# Patient Record
Sex: Female | Born: 1980 | Race: White | Hispanic: No | Marital: Married | Smoking: Never smoker
Health system: Southern US, Community
[De-identification: ages and names within clinical notes are randomized; demographics above are authoritative.]

## PROBLEM LIST (undated history)

## (undated) DIAGNOSIS — R51 Headache: Secondary | ICD-10-CM

## (undated) DIAGNOSIS — I471 Supraventricular tachycardia: Secondary | ICD-10-CM

## (undated) DIAGNOSIS — G8929 Other chronic pain: Secondary | ICD-10-CM

## (undated) DIAGNOSIS — I4719 Other supraventricular tachycardia: Secondary | ICD-10-CM

## (undated) HISTORY — DX: Other supraventricular tachycardia: I47.19

## (undated) HISTORY — DX: Supraventricular tachycardia: I47.1

---

## 2000-09-03 ENCOUNTER — Encounter: Payer: Self-pay | Admitting: Emergency Medicine

## 2000-09-03 ENCOUNTER — Emergency Department (HOSPITAL_COMMUNITY): Admission: EM | Admit: 2000-09-03 | Discharge: 2000-09-03 | Payer: Self-pay | Admitting: Emergency Medicine

## 2012-02-29 ENCOUNTER — Emergency Department (INDEPENDENT_AMBULATORY_CARE_PROVIDER_SITE_OTHER)
Admission: EM | Admit: 2012-02-29 | Discharge: 2012-02-29 | Disposition: A | Discharge status: Home / Self Care | Payer: Federal, State, Local not specified - PPO | Source: Home / Self Care | Attending: Family Medicine | Admitting: Family Medicine

## 2012-02-29 DIAGNOSIS — H9202 Otalgia, left ear: Secondary | ICD-10-CM

## 2012-02-29 DIAGNOSIS — J069 Acute upper respiratory infection, unspecified: Secondary | ICD-10-CM

## 2012-02-29 DIAGNOSIS — H9209 Otalgia, unspecified ear: Secondary | ICD-10-CM

## 2012-02-29 DIAGNOSIS — M940 Chondrocostal junction syndrome [Tietze]: Secondary | ICD-10-CM

## 2012-02-29 HISTORY — DX: Other chronic pain: G89.29

## 2012-02-29 HISTORY — DX: Headache: R51

## 2012-02-29 MED ORDER — AZITHROMYCIN 250 MG PO TABS: ORAL_TABLET | ORAL | Status: AC

## 2012-02-29 MED ORDER — BENZONATATE 200 MG PO CAPS: 200.0000 mg | ORAL_CAPSULE | Freq: Every day | ORAL | Status: AC

## 2012-02-29 NOTE — ED Notes (Signed)
Patient presents with a productive cough and nasal congestion x couple days. She states her sputum is thick and white. She tried OTC cold and congestion without relief. She also complains of left ear pain.

## 2012-02-29 NOTE — Discharge Instructions (Signed)
Take Mucinex D (guaifenesin with decongestant) twice daily for congestion.  Increase fluid intake, rest. May use Afrin nasal spray (or generic oxymetazoline) twice daily for about 5 days.  Also recommend using saline nasal spray several times daily and saline nasal irrigation (AYR is a common brand) Stop all antihistamines for now, and other non-prescription cough/cold preparations. May take Ibuprofen 200mg , 4 tabs every 8 hours with food for chest/sternum discomfort. Begin Azithromycin if not improving about 5 days or if persistent fever develops. Follow-up with family doctor if not improving 7 to 10 days.

## 2012-02-29 NOTE — ED Provider Notes (Addendum)
History     CSN: [id number]  Arrival date & time 02/29/12  1635   First MD Initiated Contact with Patient 02/29/12 1739      Chief Complaint  Patient presents with  . Cough    couple days  . Nasal Congestion    couple  days      HPI Comments: Patient complains of approximately 3 day history of gradually progressive URI symptoms beginning with a non-productive cough and a mild sore throat (now improved), followed by nasal congestion.  Complains of fatigue and initial myalgias.  Cough is now worse at night and generally non-productive during the day.  There has been no pleuritic pain, but she does complain of anterior chest discomfort with cough.  No wheezes or shortness of breath.  She has now developed a mild left earache, and left hearing is muffled.  She states that she often coughs until she gags.  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  The history is provided by the patient.    Past Medical History  Diagnosis Date  . Chronic headaches     Past Surgical History  Procedure Date  . Cesarean section     Family History  Problem Relation Age of Onset  . Heart attack Father     History  Substance Use Topics  . Smoking status: Never Smoker   . Smokeless tobacco: Never Used  . Alcohol Use: No    OB History    Grav Para Term Preterm Abortions TAB SAB Ect Mult Living                  Review of Systems + sore throat + cough No pleuritic pain, but complains of anterior chest burning sensation with cough and inspiration No wheezing + nasal congestion + post-nasal drainage No sinus pain/pressure No itchy/red eyes + left earache No hemoptysis No SOB ? fever, + chills/feels hot No nausea No vomiting No abdominal pain No diarrhea No urinary symptoms No skin rashes + fatigue + myalgias + headache Used OTC meds without relief (Nyquil) Allergies  Review of patient's allergies indicates no known allergies.  Home Medications   Current  Outpatient Rx  Name Route Sig Dispense Refill  . NORETHINDRONE ACET-ETHINYL EST 1-20 MG-MCG PO TABS Oral Take 1 tablet by mouth daily.    . AZITHROMYCIN 250 MG PO TABS  Take 2 tabs today; then begin one tab once daily for 4 more days (Rx void after 03/07/12) 6 each 0  . BENZONATATE 200 MG PO CAPS Oral Take 1 capsule (200 mg total) by mouth at bedtime. Take as needed for cough 12 capsule 0    BP 122/87  Pulse 96  Temp(Src) 99.1 F (37.3 C) (Oral)  Resp 18  Ht 5\' 9"  (1.753 m)  Wt 233 lb (105.688 kg)  BMI 34.41 kg/m2  SpO2 99%  Physical Exam Nursing notes and Vital Signs reviewed. She is Alert and oriented, euthymic mood and affect. Appearance:  Patient appears healthy, stated age, and in no acute distress.  Patient is obese (BMI 34.5) Eyes:  Pupils are equal, round, and reactive to light and accomodation.  Extraocular movement is intact.  Conjunctivae are not inflamed  Ears:  Canals normal.  Tympanic membranes normal.  Nose:  Mildly congested turbinates.  No sinus tenderness.   Pharynx:  Normal Neck:  Supple.  Slightly tender shotty posterior nodes are palpated bilaterally  Lungs:  Clear to auscultation.  Breath sounds are equal.  Chest:  Distinct tenderness to palpation over the mid-sternum. - Heart:  Regular rate and rhythm without murmurs, rubs, or gallops.  Abdomen:  Nontender without masses or hepatosplenomegaly.  Bowel sounds are present.  No CVA or flank tenderness.  Extremities:  No edema.  No calf tenderness Skin:  No rash present.   ED Course  Procedures  none   Tympanogram:  Positive peak pressure both ears.   1. Acute upper respiratory infections of unspecified site   2. Otalgia of left ear   3. Costochondritis, acute       MDM  There is no evidence of bacterial infection today.   Treat symptomatically for now.  Prescription written for Benzonatate [facility]) to take at bedtime for night-time cough.  Take Mucinex D (guaifenesin with decongestant) twice daily  for congestion.  Increase fluid intake, rest. May use Afrin nasal spray (or generic oxymetazoline) twice daily for about 5 days.  Also recommend using saline nasal spray several times daily and saline nasal irrigation (AYR is a common brand) Stop all antihistamines for now, and other non-prescription cough/cold preparations. May take Ibuprofen 200mg , 4 tabs every 8 hours with food for chest/sternum discomfort. Begin Azithromycin if not improving about 5 days or if persistent fever develops. Follow-up with family doctor if not improving 7 to 10 days.        Lattie Haw, MD 02/29/12 1815  Lattie Haw, MD 02/29/12 1816

## 2012-04-06 ENCOUNTER — Other Ambulatory Visit (HOSPITAL_COMMUNITY): Payer: Self-pay | Admitting: Chiropractic Medicine

## 2012-04-06 ENCOUNTER — Ambulatory Visit (HOSPITAL_COMMUNITY)
Admission: RE | Admit: 2012-04-06 | Discharge: 2012-04-06 | Disposition: A | Discharge status: Home / Self Care | Payer: Federal, State, Local not specified - PPO | Source: Ambulatory Visit | Attending: Chiropractic Medicine | Admitting: Chiropractic Medicine

## 2012-04-06 DIAGNOSIS — M542 Cervicalgia: Secondary | ICD-10-CM

## 2012-04-06 DIAGNOSIS — T1490XA Injury, unspecified, initial encounter: Secondary | ICD-10-CM | POA: Insufficient documentation

## 2012-04-06 DIAGNOSIS — M545 Low back pain, unspecified: Secondary | ICD-10-CM | POA: Insufficient documentation

## 2012-04-06 DIAGNOSIS — Y9366 Activity, soccer: Secondary | ICD-10-CM | POA: Insufficient documentation

## 2012-04-06 DIAGNOSIS — X58XXXA Exposure to other specified factors, initial encounter: Secondary | ICD-10-CM | POA: Insufficient documentation

## 2012-04-06 DIAGNOSIS — M549 Dorsalgia, unspecified: Secondary | ICD-10-CM

## 2012-04-06 DIAGNOSIS — R209 Unspecified disturbances of skin sensation: Secondary | ICD-10-CM | POA: Insufficient documentation

## 2012-09-06 ENCOUNTER — Emergency Department (INDEPENDENT_AMBULATORY_CARE_PROVIDER_SITE_OTHER): Payer: Federal, State, Local not specified - PPO

## 2012-09-06 ENCOUNTER — Emergency Department (INDEPENDENT_AMBULATORY_CARE_PROVIDER_SITE_OTHER)
Admission: EM | Admit: 2012-09-06 | Discharge: 2012-09-06 | Disposition: A | Discharge status: Home / Self Care | Payer: Federal, State, Local not specified - PPO | Source: Home / Self Care

## 2012-09-06 ENCOUNTER — Encounter: Payer: Self-pay | Admitting: *Deleted

## 2012-09-06 DIAGNOSIS — X58XXXA Exposure to other specified factors, initial encounter: Secondary | ICD-10-CM

## 2012-09-06 DIAGNOSIS — M25539 Pain in unspecified wrist: Secondary | ICD-10-CM

## 2012-09-06 DIAGNOSIS — S59909A Unspecified injury of unspecified elbow, initial encounter: Secondary | ICD-10-CM

## 2012-09-06 DIAGNOSIS — S63509A Unspecified sprain of unspecified wrist, initial encounter: Secondary | ICD-10-CM

## 2012-09-06 DIAGNOSIS — M79609 Pain in unspecified limb: Secondary | ICD-10-CM

## 2012-09-06 DIAGNOSIS — S59919A Unspecified injury of unspecified forearm, initial encounter: Secondary | ICD-10-CM

## 2012-09-06 NOTE — ED Notes (Signed)
Pt c/o RT wrist injury x last night while playing with her son. No OTC meds.

## 2012-09-06 NOTE — ED Provider Notes (Signed)
History     CSN: [id number]  Arrival date & time 09/06/12  1817   None     Chief Complaint  Patient presents with  . Wrist Pain   HPI Comments: Pt was playing with son yesterday, felt pop in R wrist.  No direct trauma per pt.  Has had persistent R wrist and forearm pain since this point.    Patient is a 31 y.o. female presenting with wrist pain.  Wrist Pain This is a new problem. The current episode started yesterday. The problem occurs constantly.    Past Medical History  Diagnosis Date  . Chronic headaches     Past Surgical History  Procedure Date  . Cesarean section     Family History  Problem Relation Age of Onset  . Heart attack Father     History  Substance Use Topics  . Smoking status: Never Smoker   . Smokeless tobacco: Never Used  . Alcohol Use: No    OB History    Grav Para Term Preterm Abortions TAB SAB Ect Mult Living                  Review of Systems  All other systems reviewed and are negative.    Allergies  Review of patient's allergies indicates no known allergies.  Home Medications   Current Outpatient Rx  Name Route Sig Dispense Refill  . NORETHINDRONE ACET-ETHINYL EST 1-20 MG-MCG PO TABS Oral Take 1 tablet by mouth daily.      BP 115/83  Pulse 87  Temp 97.8 F (36.6 C) (Oral)  Resp 18  Ht 5\' 9"  (1.753 m)  Wt 232 lb (105.235 kg)  BMI 34.26 kg/m2  SpO2 100%  LMP 08/13/2012  Physical Exam  Constitutional: She appears well-developed and well-nourished.  HENT:  Head: Normocephalic and atraumatic.  Eyes: Conjunctivae normal are normal. Pupils are equal, round, and reactive to light.  Neck: Normal range of motion. Neck supple.  Cardiovascular: Normal rate and regular rhythm.   Pulmonary/Chest: Effort normal and breath sounds normal.  Abdominal: Soft. Bowel sounds are normal.  Musculoskeletal:       + TTP along R wrist, ulnar sided with some radiation along R lateral forearm.  Full ROM.  + wrist pain flexion and  extension.   Neurological: She is alert.  Skin: Skin is warm.    ED Course  Procedures (including critical care time)  Labs Reviewed - No data to display Dg Forearm Right  09/06/2012  *RADIOLOGY REPORT*  Clinical Data: Injury with pain  RIGHT FOREARM - 2 VIEW  Comparison: None.  Findings: No evidence of fracture, dislocation or other focal finding.  IMPRESSION: Normal   Original Report Authenticated By: Thomasenia Sales, M.D.    Dg Wrist Complete Right  09/06/2012  *RADIOLOGY REPORT*  Clinical Data: Injury with pain  RIGHT WRIST - COMPLETE 3+ VIEW  Comparison: None.  Findings: No evidence of fracture, dislocation, degenerative change or other focal finding.  IMPRESSION: Normal radiographs   Original Report Authenticated By: Thomasenia Sales, M.D.      1. Wrist sprain       MDM  Xrays negative for fracture  RICE and NSAIDs.  Will place in wrist brace.  Discussed general red flags for reevaluation. Follow up as needed.      The patient and/or caregiver has been counseled thoroughly with regard to treatment plan and/or medications prescribed including dosage, schedule, interactions, rationale for use, and possible side effects and they verbalize  understanding. Diagnoses and expected course of recovery discussed and will return if not improved as expected or if the condition worsens. Patient and/or caregiver verbalized understanding.             Doree Albee, MD 09/16/12 [phone]

## 2014-01-24 IMAGING — CR DG FOREARM 2V*R*
2 series · 2 of 2 positions shown · non-contrast
Comparison: None.

CLINICAL DATA: Injury with pain

RIGHT FOREARM - 2 VIEW

[view not recorded (1 of 2)]
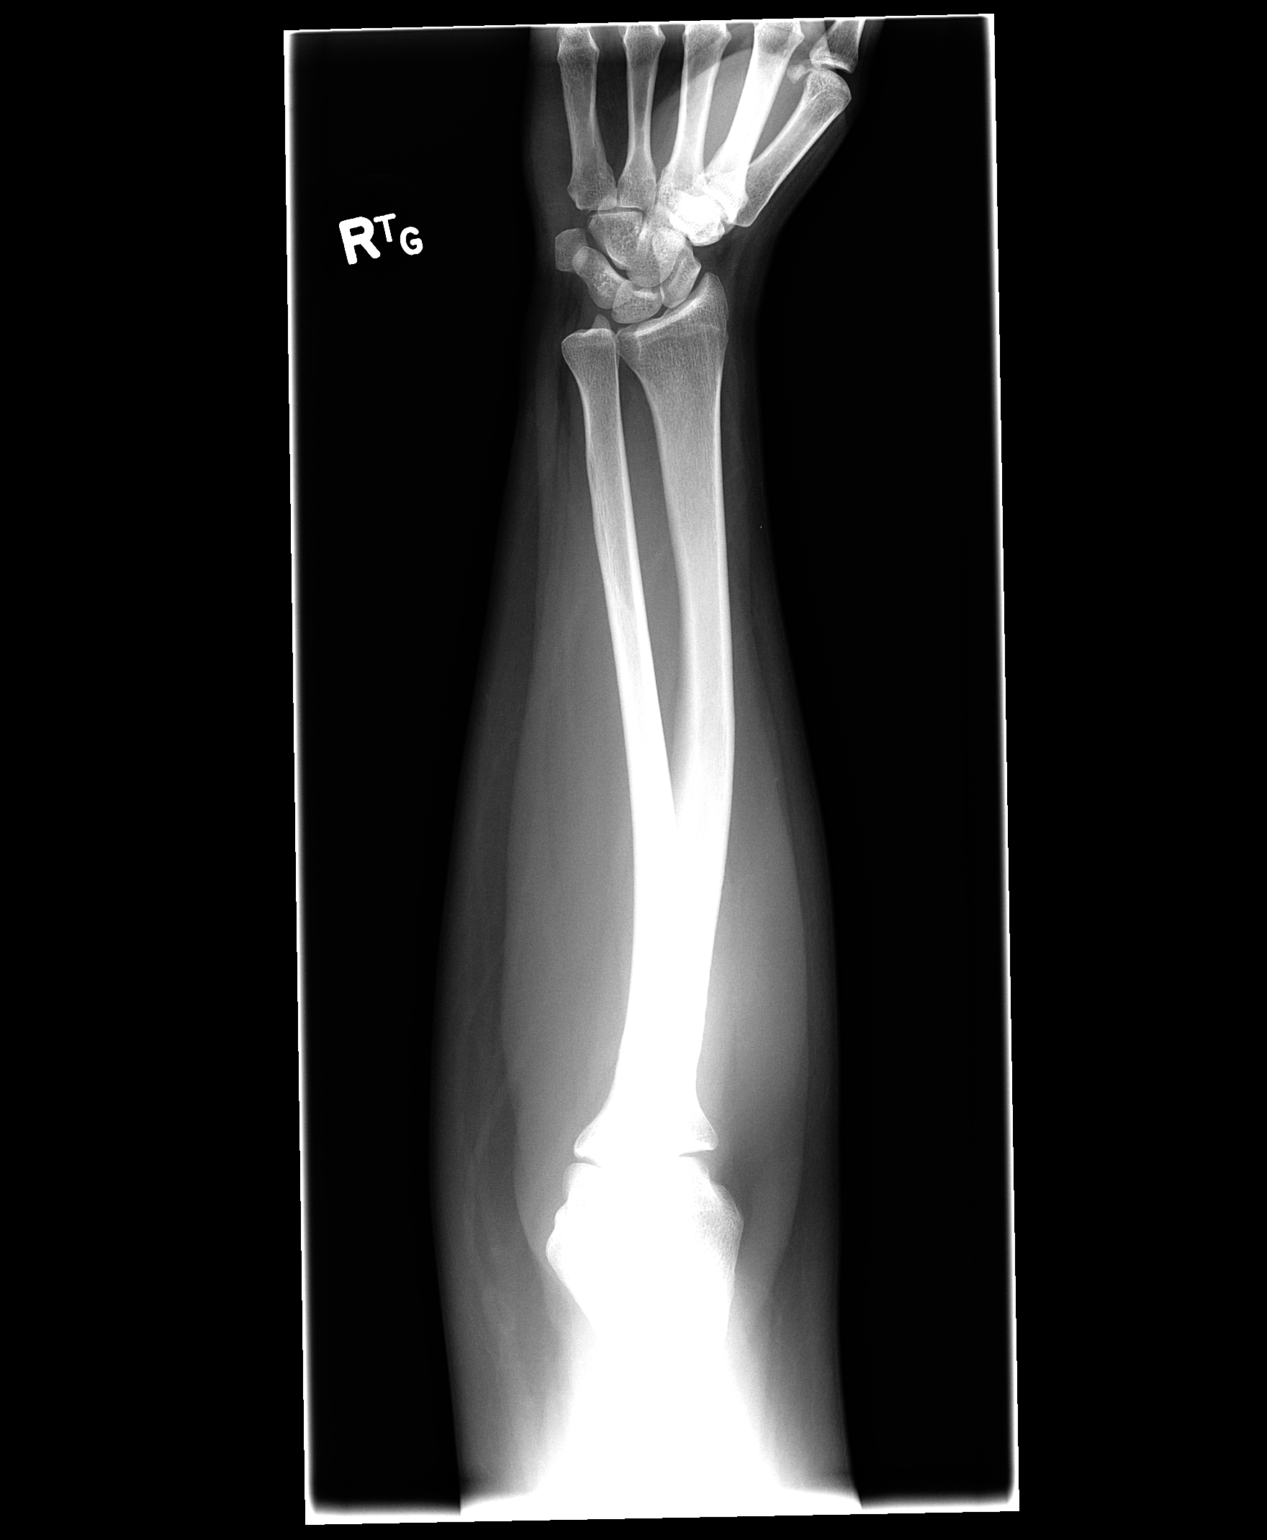

[view not recorded (2 of 2)]
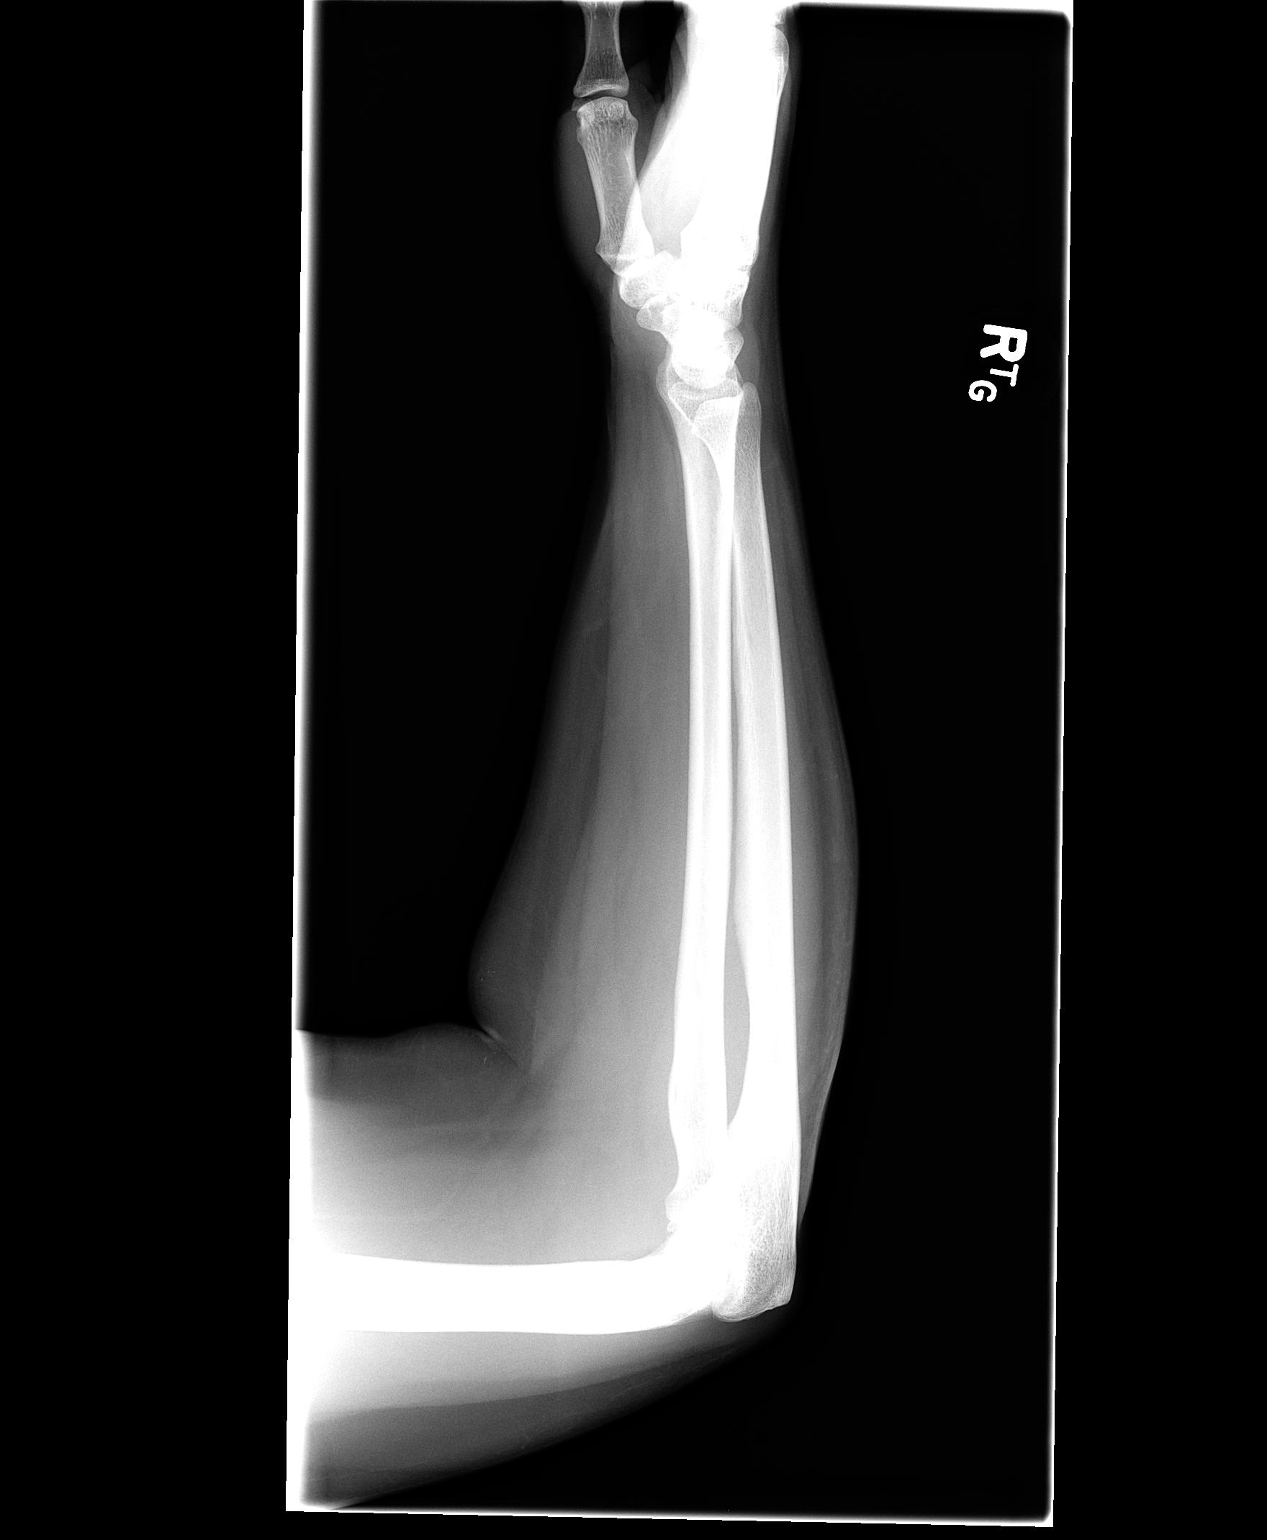

[2 of 2 positions shown; findings below may reference images not displayed]

FINDINGS: No evidence of fracture, dislocation or other focal
finding.
IMPRESSION: Normal

## 2016-05-20 ENCOUNTER — Ambulatory Visit (HOSPITAL_COMMUNITY): Payer: Federal, State, Local not specified - PPO | Admitting: Licensed Clinical Social Worker

## 2016-06-10 ENCOUNTER — Encounter (HOSPITAL_COMMUNITY): Payer: Self-pay | Admitting: Licensed Clinical Social Worker

## 2016-06-10 ENCOUNTER — Ambulatory Visit (INDEPENDENT_AMBULATORY_CARE_PROVIDER_SITE_OTHER): Payer: Federal, State, Local not specified - PPO | Admitting: Licensed Clinical Social Worker

## 2016-06-10 DIAGNOSIS — F331 Major depressive disorder, recurrent, moderate: Secondary | ICD-10-CM | POA: Diagnosis not present

## 2016-06-10 NOTE — Progress Notes (Signed)
Comprehensive Clinical Assessment (CCA) Note  06/10/2016 Gerre Crabtree [id number]  Visit Diagnosis:      ICD-9-CM ICD-10-CM   1. Major depressive disorder, recurrent episode, moderate (HCC) 296.32 F33.1       CCA Part One  Part One has been completed on paper by the patient.  (See scanned document in Chart Review)  CCA Part Two A  Intake/Chief Complaint:  CCA Intake With Chief Complaint CCA Part Two Date: 06/10/16 CCA Part Two Time: 1308 Chief Complaint/Presenting Problem: Noticed about 6 months to a year she would cry for no reason. For example, standing in [place], hearing a song, having a conversation and it triggers crying. Dad died 7 years ago, haven't dealt with it, she internalizes things and this is the only thing she can think of. She is not sure what it is because she has never felt this way before, her sister had a traumatic birth when she needed to be ventilated for a week, she delivered on December 4 and happened December 6. In January she was at work and it happened twice, her heart rate went high, not sure why it happened, the second time she had to be hospitalized for three days. 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 First time it happened and happened in a three day heart span. he wants her to lose weight. She doesn't feel like working out. She has no desire.  Patients Currently Reported Symptoms/Problems: Crying out the blue and internalizes things. She is tried all the time. She works third shift and this may have something to do with it. Sometimes she feels like it excessive. PCP has put her on Prozac and she still feels that way.  Collateral Involvement: no Individual's Strengths: she has always had low self-esteem, her honestly Individual's Preferences: she wants to figure out why and how to fix it Individual's Abilities: she is pretty good at everything she tries, she does arts and crafts and used to like to exercise Type of  Services Patient Feels Are Needed: individual therapy, medication Initial Clinical Notes/Concerns: Psychiatric History-first time  Mental Health Symptoms Depression:  Depression: Change in energy/activity, Fatigue, Increase/decrease in appetite, Irritability, Sleep (too much or little), Tearfulness, Weight gain/loss, Worthlessness (thoughts at times she asked herself what would happen if she died, she is a Engineer, 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, denies SI, past SI , SA, SIB)  Mania:  Mania: N/A  Anxiety:   Anxiety: Difficulty concentrating, Fatigue, Irritability, Restlessness, Sleep, Tension, Worrying (not excessive, doesn't interfere with functioning, a couple days a week.)  Psychosis:  Psychosis: N/A  Trauma:  Trauma: N/A  Obsessions:  Obsessions: N/A  Compulsions:  Compulsions: N/A  Inattention:  Inattention: N/A  Hyperactivity/Impulsivity:  Hyperactivity/Impulsivity: N/A  Oppositional/Defiant Behaviors:  Oppositional/Defiant Behaviors: N/A  Borderline Personality:  Emotional Irregularity: N/A  Other Mood/Personality Symptoms:      PH Q9=12-moderate depression CAD 7= 10 moderate anxiety  Mental Status Exam Appearance and self-care  Stature:  Stature: Tall  Weight:  Weight: Average weight  Clothing:     Grooming:  Grooming: Normal  Cosmetic use:  Cosmetic Use: None  Posture/gait:  Posture/Gait: Normal  Motor activity:  Motor Activity: Not Remarkable  Sensorium  Attention:  Attention: Normal  Concentration:  Concentration: Normal  Orientation:  Orientation: X5  Recall/memory:  Recall/Memory: Normal  Affect and Mood  Affect:  Affect: Appropriate  Mood:  Mood: Depressed  Relating  Eye contact:  Eye Contact:  Normal  Facial expression:  Facial Expression: Responsive  Attitude toward examiner:  Attitude Toward Examiner: Cooperative  Thought and Language  Speech flow: Speech Flow: Normal  Thought content:  Thought Content: Appropriate to mood and circumstances   Preoccupation:     Hallucinations:     Organization:     Company secretary of Knowledge:  Fund of Knowledge: Average  Intelligence:  Intelligence: Average  Abstraction:  Abstraction: Normal  Judgement:  Judgement: Fair  Dance movement psychotherapist:  Reality Testing: Realistic  Insight:  Insight: Fair  Decision Making:  Decision Making: Normal  Social Functioning  Social Maturity:  Social Maturity: Responsible  Social Judgement:  Social Judgement: Normal  Stress  Stressors:  Stressors: Veterinary surgeon, Work  Coping Ability:  Coping Ability: Exhausted, Building surveyor Deficits:     Supports:      Family and Psychosocial History: Family history Marital status: Married Number of Years Married: 9.75 What types of issues is patient dealing with in the relationship?: none-good relationship Are you sexually active?: Yes What is your sexual orientation?: heterosexual Has your sexual activity been affected by drugs, alcohol, medication, or emotional stress?: no How many children?: 1 How is patient's relationship with their children?: 73 year old boy-good relationship  Childhood History:  Childhood History By whom was/is the patient raised?: Both parents Additional childhood history information: good children Description of patient's relationship with caregiver when they were a child: good-mom, dad-good until 20 and 13 it was strained, he was alcoholic and drug abuser, he wouldn't come home for a couple of days, he was not in the picture a lot Patient's description of current relationship with people who raised him/her: mom-good, dad-died in 04/12/11 How were you disciplined when you got in trouble as a child/adolescent?: spanked Does patient have siblings?: Yes Number of Siblings: 2 Description of patient's current relationship with siblings: she is the middle, great relationship Did patient suffer any verbal/emotional/physical/sexual abuse as a child?: No Did patient suffer from severe  childhood neglect?: No Has patient ever been sexually abused/assaulted/raped as an adolescent or adult?: No Was the patient ever a victim of a crime or a disaster?: No Witnessed domestic violence?: No Has patient been effected by domestic violence as an adult?: No  CCA Part Two B  Employment/Work Situation: Employment / Work Situation Employment situation: Employed Where is patient currently employed?: [employer] long has patient been employed?: 04-12-2003 Patient's job has been impacted by current illness: No What is the longest time patient has a held a job?: 13 Where was the patient employed at that time?: [employer] Has patient ever been in the [employer]?: No Has patient ever served in combat?: No Did You Receive Any Psychiatric Treatment/Services While in Equities trader?: No Are There Guns or Other Weapons in Your Home?: No  Education: Engineer, civil (consulting) Currently Attending: started school-going for Masters in Nursing Education Last Grade Completed: 16 Name of [employer] School: [employer] Did [employer]?: Yes What Type of College 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) Did Designer, television/film set?: No What Was Your Major?: Nursing Did You Have Any Special Interests In School?: nursing Did You Have An Individualized Education Program (IIEP): No Did You Have Any Difficulty At School?: Yes (anxiety with tests but loves to learn) Were Any Medications Ever Prescribed For These Difficulties?: No  Religion: Religion/Spirituality Are You A Religious Person?: Yes (spiritual, believes in God) What is Your Religious Affiliation?: Quaker How Might This Affect Treatment?:  no  Leisure/Recreation:    Exercise/Diet: Exercise/Diet Do You Exercise?: No Have You Gained or Lost A Significant Amount of Weight in the Past Six Months?: Yes-Gained Number of Pounds Gained: 40 Do You Follow a Special Diet?: No Do You Have Any Trouble Sleeping?:  Yes Explanation of Sleeping Difficulties: can't get to sleep, when she does she wakes and has trouble getting back to sleep  CCA Part Two C  Alcohol/Drug Use: Alcohol / Drug Use Pain Medications: see med list, chronic migraiines  Prescriptions: see med list Over the Counter: see med list History of alcohol / drug use?: No history of alcohol / drug abuse (rarely drink alcohol)                      CCA Part Three  ASAM's:  Six Dimensions of Multidimensional Assessment  Dimension 1:  Acute Intoxication and/or Withdrawal Potential:     Dimension 2:  Biomedical Conditions and Complications:     Dimension 3:  Emotional, Behavioral, or Cognitive Conditions and Complications:     Dimension 4:  Readiness to Change:     Dimension 5:  Relapse, Continued use, or Continued Problem Potential:     Dimension 6:  Recovery/Living Environment:      Substance use Disorder (SUD)    Social Function:  Social Functioning Social Maturity: Responsible Social Judgement: Normal  Stress:  Stress Stressors: Veterinary surgeonGrief/losses, Work Coping Ability: Exhausted, Overwhelmed Patient Takes Medications The Way The Doctor Instructed?: Yes Priority Risk: Low Acuity  Risk Assessment- Self-Harm Potential: Risk Assessment For Self-Harm Potential Thoughts of Self-Harm: No current thoughts Availability of Means: No access/NA Additional Information for Self-Harm Potential: Family History of Suicide Additional Comments for Self-Harm Potential: Dad tried to commit suicide in front of her  Risk Assessment -Dangerous to Others Potential: Risk Assessment For Dangerous to Others Potential Method: No Plan Availability of Means: No access or NA Intent: Vague intent or NA Notification Required: No need or identified person Additional Comments for Danger to Others Potential: Dad  DSM5 Diagnoses: Patient Active Problem List   Diagnosis Date Noted  . Major depressive disorder, recurrent episode, moderate (HCC)  06/10/2016    Patient Centered Plan: Patient is on the following Treatment Plan(s):  Anxiety, Depression and Low Self-Esteem  Recommendations for Services/Supports/Treatments: Recommendations for Services/Supports/Treatments Recommendations For Services/Supports/Treatments: Individual Therapy, Medication Management  Treatment Plan Summary: Patient is a 35 year old married female who reports in the past year crying for no apparent reason. She recognizes that she internalizes things and reviewed that she has dealt with a lot of stressors. She has not grieved her dad who died 7 years ago, her sister had a difficult pregnancy and in the ICU for a week, she works as a Engineer, civil (consulting)nurse in oncology and has seen a lot of young patients recently at her work, she has recently been diagnosed with atrial tachycardia, has daily migraines, her dad abused drug and alcohol and she witnessed him trying to kill himself. The patient works the third shift which may contribute to fatigue. She denies SI, past SI, past SA, HI or d/a abuse. This is her first psychiatric experience. She wants to be able to figure out what's going on and develop skills to improve symptoms. She has been prescribed Prozac by her primary care doctor and this has helped but continues to experience symptoms so she is recommended to psychiatrist for medication management. She is also recommended for individual therapy to address underlying issues that are causing symptoms, learning coping  skills to manage her emotions and supportive interventions.    Referrals to Alternative Service(s): Referred to Alternative Service(s):   Place:   Date:   Time:    Referred to Alternative Service(s):   Place:   Date:   Time:    Referred to Alternative Service(s):   Place:   Date:   Time:    Referred to Alternative Service(s):   Place:   Date:   Time:     Vernard Gram A

## 2016-06-22 ENCOUNTER — Ambulatory Visit (HOSPITAL_COMMUNITY): Payer: Self-pay | Admitting: Licensed Clinical Social Worker

## 2016-07-23 ENCOUNTER — Ambulatory Visit (INDEPENDENT_AMBULATORY_CARE_PROVIDER_SITE_OTHER): Payer: Federal, State, Local not specified - PPO | Admitting: Psychiatry

## 2016-07-23 ENCOUNTER — Encounter (HOSPITAL_COMMUNITY): Payer: Self-pay | Admitting: Psychiatry

## 2016-07-23 VITALS — HR 102 | Ht 69.0 in | Wt 251.0 lb

## 2016-07-23 DIAGNOSIS — F331 Major depressive disorder, recurrent, moderate: Secondary | ICD-10-CM | POA: Diagnosis not present

## 2016-07-23 DIAGNOSIS — F419 Anxiety disorder, unspecified: Secondary | ICD-10-CM | POA: Diagnosis not present
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
## 2016-08-09 ENCOUNTER — Ambulatory Visit (INDEPENDENT_AMBULATORY_CARE_PROVIDER_SITE_OTHER): Payer: Federal, State, Local not specified - PPO | Admitting: Licensed Clinical Social Worker

## 2016-08-09 DIAGNOSIS — F419 Anxiety disorder, unspecified: Secondary | ICD-10-CM | POA: Diagnosis not present

## 2016-08-09 DIAGNOSIS — F331 Major depressive disorder, recurrent, moderate: Secondary | ICD-10-CM | POA: Diagnosis not present

## 2016-08-09 NOTE — Progress Notes (Signed)
   THERAPIST PROGRESS NOTE  Session Time: 2:00 PM-2:55 PM  Participation Level: Active  Behavioral Response: CasualAlertDysphoric, tearful  Type of Therapy: Individual Therapy  Treatment Goals addressed: stress management and improvement of mood  Interventions: CBT, Strength-based, Supportive, Reframing and Other: stress management, insight to negative impact of perfectionistic thiking  Summary: Colleen Valdez is a 35 y.o. female who presents with feeling overwhelmed in that she is going to graduate school, has a 36-year-old, married, and working. She says she can function more when she is busy but she says today she is not having a good day and explains that when she is like this she doesn't want to do anything. She described feeling as if there is balls up in the air that she is juggling and if one drops it'll all fall apart and it will be a disaster. Discussed cutting back and responsibilities and saying no as healthy to set boundaries patient says she's always been one to help everybody at the expense of taking care of herself. She believes it could be related to control and feels that if she is not involved the task will not be done the way it needs to be done. She relates it is difficult for her to say no as she relates that she feels she is not living up to her responsibilities or living up to her word. Became tearful and describing her feelings and her stressors. She reviewed worksheet on healthy perfectionism and neurotic perfectionism and identified some attitudes of neurotic perfectionism. Frustrated that she can't do things that she knows will help such as exercising and described this as a vicious circle. Encouraged patient on healthy attitudes is helpful in coping. Completed treatment plan and patient wants to work on stress management strategies and strategies to help improve mood.    Suicidal/Homicidal: No  Therapist Response: Validated patient on her feelings. and gave her positive  feedback for the goals she is set for herself. Discussed stress management techniques as she feels overwhelmed including lowering expectations of self, setting boundaries, recognizing the inability to control everything and letting go of control. Discussed self talk that could help with more helpful perspective.  Discussed that patient needs balance in her life and make herself a priority so she is able to better manage stressors. Provided motivational strategies to build motivation to make attitude and behavior change. Completed treatment plan. Reviewed worksheet on healthy perfectionism and neurotic perfectionism so patient could identify attitudes that were helpful for her and ones that were not helpful.  Plan: Return again in 3 weeks.2.Patient work on attitudes that be supportive of stress management.3.Patient gain insight to strategies to help with stress and improve mood and implement in her life.   Diagnosis: Axis I: Major Depressive Disorder, recurrent, moderate, Anxiety Disorder unspecified anxiety disorder type    Axis II: No diagnosis    Bowman,Mary A, LCSW 08/09/2016

## 2016-08-13 ENCOUNTER — Encounter (HOSPITAL_COMMUNITY): Payer: Self-pay | Admitting: Psychiatry

## 2016-08-13 ENCOUNTER — Ambulatory Visit (INDEPENDENT_AMBULATORY_CARE_PROVIDER_SITE_OTHER): Payer: Federal, State, Local not specified - PPO | Admitting: Psychiatry

## 2016-08-13 VITALS — BP 124/72 | HR 84 | Resp 16 | Ht 69.0 in | Wt 264.0 lb

## 2016-08-13 DIAGNOSIS — F331 Major depressive disorder, recurrent, moderate: Secondary | ICD-10-CM

## 2016-08-13 DIAGNOSIS — F419 Anxiety disorder, unspecified: Secondary | ICD-10-CM | POA: Diagnosis not present

## 2016-08-13 MED ORDER — FLUOXETINE HCL 10 MG PO CAPS: 30.0000 mg | ORAL_CAPSULE | Freq: Every day | ORAL | 0 refills | Status: DC
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
## 2016-08-20 ENCOUNTER — Emergency Department
Admission: EM | Admit: 2016-08-20 | Discharge: 2016-08-20 | Disposition: A | Discharge status: Home / Self Care | Payer: Federal, State, Local not specified - PPO | Source: Home / Self Care | Attending: Emergency Medicine | Admitting: Emergency Medicine

## 2016-08-20 ENCOUNTER — Emergency Department (INDEPENDENT_AMBULATORY_CARE_PROVIDER_SITE_OTHER): Payer: Federal, State, Local not specified - PPO

## 2016-08-20 ENCOUNTER — Encounter: Payer: Self-pay | Admitting: Emergency Medicine

## 2016-08-20 DIAGNOSIS — R509 Fever, unspecified: Secondary | ICD-10-CM

## 2016-08-20 DIAGNOSIS — J039 Acute tonsillitis, unspecified: Secondary | ICD-10-CM

## 2016-08-20 DIAGNOSIS — R058 Other specified cough: Secondary | ICD-10-CM

## 2016-08-20 DIAGNOSIS — R05 Cough: Secondary | ICD-10-CM | POA: Diagnosis not present

## 2016-08-20 DIAGNOSIS — J029 Acute pharyngitis, unspecified: Secondary | ICD-10-CM

## 2016-08-20 DIAGNOSIS — J209 Acute bronchitis, unspecified: Secondary | ICD-10-CM

## 2016-08-20 LAB — POCT RAPID STREP A (OFFICE): Rapid Strep A Screen: NEGATIVE

## 2016-08-20 MED ORDER — CEFTRIAXONE SODIUM 1 G IJ SOLR
1.0000 g | INTRAMUSCULAR | Status: AC
  Administered 2016-08-20: 1 g via INTRAMUSCULAR

## 2016-08-20 MED ORDER — CEFDINIR 300 MG PO CAPS: 300.0000 mg | ORAL_CAPSULE | Freq: Two times a day (BID) | ORAL | 0 refills | Status: DC

## 2016-08-20 MED ORDER — HYDROCOD POLST-CPM POLST ER 10-8 MG/5ML PO SUER: 5.0000 mL | Freq: Two times a day (BID) | ORAL | 0 refills | Status: DC | PRN

## 2016-08-20 MED ORDER — FLUCONAZOLE 150 MG PO TABS: 150.0000 mg | ORAL_TABLET | Freq: Once | ORAL | 1 refills | Status: AC

## 2016-08-20 NOTE — ED Triage Notes (Signed)
Sore throat, cough, chest hurts, chills, body aches x 3 days. Was on ABTs 3 weeks ago for same sxs

## 2016-08-20 NOTE — ED Provider Notes (Signed)
[facility]    CSN: [id number] Arrival date & time: 08/20/16  1229  First Provider Contact:  First MD Initiated Contact with Patient 08/20/16 1256        History   Chief Complaint Chief Complaint  Patient presents with  . Sore Throat  URI HISTORY  Shequilla is a 35 y.o. female who complains of onset of cold symptoms for 3  days. Progressively worsening cough productive of discolored sputum with sore throat and fever and chills and fatigue. Have been using over-the-counter treatment which helps a little bit. Tried Robitussin with codeine cough syrup and Tessalon Perles which did not help the cough. History of similar URI 3 weeks ago treated by another physician with antibiotic 3 weeks ago and symptoms had resolved until these new ones recurred. 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 No wheezing, no history of asthma. + chills/sweats +  Fever  Minimal  Nasal congestion No Discolored Post-nasal drainage No sinus pain/pressure Positive, moderate to severe sore throat  +  cough No wheezing Positive chest congestion No hemoptysis No shortness of breath No pleuritic pain  No itchy/red eyes No earache  No nausea No vomiting No abdominal pain No diarrhea  No skin rashes +  Fatigue No myalgias No headache   LMP 08/04/2016    Angelle Maurine MinisterDennis is a 35 y.o. female.   HPI  Past Medical History:  Diagnosis Date  . Atrial tachycardia (HCC)   . Chronic headaches     Patient Active Problem List   Diagnosis Date Noted  . Major depressive disorder, recurrent episode, moderate (HCC) 06/10/2016    Past Surgical History:  Procedure Laterality Date  . CESAREAN SECTION      OB History    No data available       Home Medications    Prior to Admission medications   Medication Sig Start Date End Date Taking? Authorizing Provider  polyethylene glycol (MIRALAX / GLYCOLAX) packet Take 17 g by mouth daily.   Yes Historical  Provider, MD  cefdinir (OMNICEF) 300 MG capsule Take 1 capsule (300 mg total) by mouth 2 (two) times daily. X 10 days 08/20/16   Lajean Manesavid Massey, MD  chlorpheniramine-HYDROcodone [facility](TUSSIONEX PENNKINETIC ER) 10-8 MG/5ML SUER Take 5 mLs by mouth every 12 (twelve) hours as needed for cough. Caution: May cause drowsiness 08/20/16   Lajean Manesavid Massey, MD  cyclobenzaprine (FLEXERIL) 10 MG tablet Take 10 mg by mouth 3 (three) times daily as needed for muscle spasms.    Historical Provider, MD  fluconazole (DIFLUCAN) 150 MG tablet Take 1 tablet (150 mg total) by mouth once. Take 1 now, then may repeat x1 in 4 days, for yeast infection. 08/20/16 08/20/16  Lajean Manesavid Massey, MD  FLUoxetine (PROZAC) 10 MG capsule Take 3 capsules (30 mg total) by mouth daily. 08/13/16   Thresa RossNadeem Akhtar, MD  gabapentin (NEURONTIN) 100 MG capsule Take 300 mg by mouth 2 (two) times daily. 07/19/16   Historical Provider, MD  imipramine (TOFRANIL) 25 MG tablet Take 25 mg by mouth at bedtime.    Historical Provider, MD  Magnesium 400 MG CAPS Take by mouth.    Historical Provider, MD  metaxalone (SKELAXIN) 800 MG tablet Take 800 mg by mouth 3 (three) times daily.    Historical Provider, MD  nadolol (CORGARD) 40 MG tablet Take 40 mg by mouth daily.    Historical Provider, MD  norethindrone-ethinyl estradiol (MICROGESTIN,JUNEL,LOESTRIN) 1-20 MG-MCG tablet Take 1 tablet by mouth daily. Reported  on 06/10/2016    Historical Provider, MD  promethazine (PHENERGAN) 25 MG tablet Take 25 mg by mouth every 6 (six) hours as needed for nausea or vomiting.    Historical Provider, MD  Riboflavin (B2 PO) Take by mouth.    Historical Provider, MD    Family History Family History  Problem Relation Age of Onset  . Heart attack Father     diagnosed with AntiSocial Personality Disorder  . Alcohol abuse Father   . Drug abuse Father   . Personality disorder Father     Social History Social History  Substance Use Topics  . Smoking status: Never Smoker  . Smokeless  tobacco: Never Used  . Alcohol use No     Allergies   Review of patient's allergies indicates no known allergies.   Review of Systems Review of Systems  All other systems reviewed and are negative.    Physical Exam Triage Vital Signs ED Triage Vitals  Enc Vitals Group     BP 08/20/16 1255 124/89     Pulse Rate 08/20/16 1255 88     Resp --      Temp 08/20/16 1255 97.9 F (36.6 C)     Temp Source 08/20/16 1255 Oral     SpO2 08/20/16 1255 99 %     Weight 08/20/16 1255 264 lb (119.7 kg)     Height 08/20/16 1255 5\' 9"  (1.753 m)     Head Circumference --      Peak Flow --      Pain Score 08/20/16 1258 2     Pain Loc --      Pain Edu? --      Excl. in GC? --    No data found.   Updated Vital Signs BP 124/89 (BP Location: Left Arm)   Pulse 88   Temp 97.9 F (36.6 C) (Oral)   Ht 5\' 9"  (1.753 m)   Wt 264 lb (119.7 kg)   LMP 07/30/2016   SpO2 99%   BMI 38.99 kg/m   Visual Acuity Right Eye Distance:   Left Eye Distance:   Bilateral Distance:    Right Eye Near:   Left Eye Near:    Bilateral Near:     Physical Exam  Constitutional: She is oriented to person, place, and time. She appears well-developed and well-nourished. No distress.  HENT:  Head: Normocephalic and atraumatic.  Right Ear: Tympanic membrane normal.  Left Ear: Tympanic membrane normal.  Nose: Nose normal.  Mouth/Throat: No oropharyngeal exudate.  Posterior pharynx red. Tonsils 2+, injected, no exudate. Airway intact  Eyes: Conjunctivae are normal. Pupils are equal, round, and reactive to light. Right eye exhibits no discharge. Left eye exhibits no discharge. No scleral icterus.  Neck: Neck supple. No JVD present. No tracheal deviation present.  Cardiovascular: Normal rate, regular rhythm and normal heart sounds.   Pulmonary/Chest: No stridor. No respiratory distress. She has no wheezes. She has rhonchi. She has no rales.  Diffuse rhonchi present bilaterally  Lymphadenopathy:    She has no  cervical adenopathy.  Neurological: She is alert and oriented to person, place, and time.  Skin: Skin is warm and dry.  Nursing note and vitals reviewed.    UC Treatments / Results  Labs (all labs ordered are listed, but only abnormal results are displayed) Labs Reviewed  STREP A DNA PROBE  POCT RAPID STREP A (OFFICE)    EKG  EKG Interpretation None       Radiology Dg Chest 2  View  Result Date: 08/20/2016 CLINICAL DATA:  Productive cough and fever for 1 week EXAM: CHEST  2 VIEW COMPARISON:  None available FINDINGS: The heart size and mediastinal contours are within normal limits. Both lungs are clear. The visualized skeletal structures are unremarkable. IMPRESSION: No active cardiopulmonary disease. Electronically Signed   By: Judie Petit.  Shick M.D.   On: 08/20/2016 13:15    Procedures Procedures (including critical care time)  Medications Ordered in UC Medications  cefTRIAXone (ROCEPHIN) injection 1 g (1 g Intramuscular Given 08/20/16 1426)     Initial Impression / Assessment and Plan / UC Course  I have reviewed the triage vital signs and the nursing notes.  Pertinent labs & imaging results that were available during my care of the patient were reviewed by me and considered in my medical decision making (see chart for details).  Clinical Course    Tonsillitis and acute bronchitis. We'll cover bacterial causes with Rocephin 1 g IM stat, then Omnicef 300 twice a day 10 days. Follow-up with your primary care doctor in 5-7 days if not improving, or sooner if symptoms become worse. Precautions discussed. Red flags discussed. Questions invited and answered. Patient voiced understanding and agreement.   Final Clinical Impressions(s) / UC Diagnoses   Final diagnoses:  Pharyngitis  Productive cough  Fever  Tonsillitis  Acute bronchitis, unspecified organism    New Prescriptions New Prescriptions   CEFDINIR (OMNICEF) 300 MG CAPSULE    Take 1 capsule (300 mg total) by  mouth 2 (two) times daily. X 10 days   CHLORPHENIRAMINE-HYDROCODONE (TUSSIONEX PENNKINETIC ER) 10-8 MG/5ML SUER    Take 5 mLs by mouth every 12 (twelve) hours as needed for cough. Caution: May cause drowsiness   FLUCONAZOLE (DIFLUCAN) 150 MG TABLET    Take 1 tablet (150 mg total) by mouth once. Take 1 now, then may repeat x1 in 4 days, for yeast infection.  Prescription for Diflucan at her request in case she gets yeast infection   Lajean Manes, MD 08/20/16 1444

## 2016-08-21 LAB — STREP A DNA PROBE: GASP: NOT DETECTED

## 2016-08-22 ENCOUNTER — Telehealth: Payer: Self-pay | Admitting: Emergency Medicine

## 2016-08-22 NOTE — Telephone Encounter (Signed)
No answer left a message to return a call if needed.

## 2016-09-06 ENCOUNTER — Ambulatory Visit (INDEPENDENT_AMBULATORY_CARE_PROVIDER_SITE_OTHER): Payer: Federal, State, Local not specified - PPO | Admitting: Licensed Clinical Social Worker

## 2016-09-06 DIAGNOSIS — F331 Major depressive disorder, recurrent, moderate: Secondary | ICD-10-CM | POA: Diagnosis not present

## 2016-09-06 NOTE — Progress Notes (Signed)
   THERAPIST PROGRESS NOTE  Session Time: 10 AM to 10:50 AM  Participation Level: Active  Behavioral Response: CasualAlertEuthymic  Type of Therapy: Individual Therapy  Treatment Goals addressed: tress management and improvement of mood  Interventions: Solution Focused, Strength-based, Supportive, Reframing and Other: Healthy relationship skills, stress management  Summary: Colleen Valdez is a 35 y.o. female who presents with saying that she set a boundary at work and saying that she was unable to do this task. She had mixed feelings. She got to the point where she was going to have a mental breakdown.She has some relief because of decreasing a commitment. Opened a lot of different emotions and feelings In her life. Discussed healthy functioning as taking care of self. This open the door  about feelings she has had about certain situations for a while. She explained that she has been making her husband happy but he has not reciprocated in efforts to make her happy. She is tired of giving. He takes it as a personal attack. Panic attack due to relationship issues. She is unsure of her needs. Difference in her, and she is not sure if bad and good. Discussed each partner being attuned to what ways they feel loved and to be able to let the other one know. She says not to think about things such as watching TV make her happy and jogging but also says she's not able to relax and worries during these activities. She feels that recognizing the need to take care of herself, identify her needs and sets limits is a huge step. She recognizing taking steps to change things as good but at the same time she feels selflish and has caused panic attacks with conflict in a relationship recently.  n issue is a huge step. Taking steps to change things good, selfish, cause panic attacks.  Suicidal/Homicidal: No  Therapist Response: Provided positive feedback to patient as we have been working on setting boundaries and  she is taking positive steps to do this. Discussed dignity of risk and that acquiring skills requires getting out one's comfort zone to help patient understand mixed feelings of making changes. Explained that setting boundaries does not change her value and is actually healthy and taking care of self for better functioning. It also helps to empower her. Discussed healthy relationship skills and learning and applying to relationship to help create a healthier relationship. Discussed some strategies for patient to figure out what her needs are. Discussed self-care as being helpful and paying attention in the day to things that make her happy. Patient reports liking to jog but she stresses and watching television but she also gets anxious about things she needs to get done. Utilized reframing to help patient and better able to enjoy the activities that should bring joy in positive emotions.  Plan: Return again in 2 weeks.2. Patient will review Felicity Pellegrinied talk video on skills for healthy relationships by Augusto GambleJoanne Devila in order to gain insight and build a healthy relationship skills. 3. Patient explore her needs and set limits to take care of self and engage in healthy functioning  Diagnosis: Axis I:  Major Depressive Disorder, recurrent, moderate, Anxiety Disorder unspecified anxiety disorder type    Axis II: No diagnosis    Bowman,Mary A, LCSW 09/06/2016

## 2016-09-10 ENCOUNTER — Encounter (HOSPITAL_COMMUNITY): Payer: Self-pay | Admitting: Psychiatry

## 2016-09-10 ENCOUNTER — Other Ambulatory Visit (HOSPITAL_COMMUNITY): Payer: Self-pay | Admitting: Psychiatry

## 2016-09-10 ENCOUNTER — Ambulatory Visit (INDEPENDENT_AMBULATORY_CARE_PROVIDER_SITE_OTHER): Payer: Federal, State, Local not specified - PPO | Admitting: Psychiatry

## 2016-09-10 VITALS — BP 124/70 | HR 82 | Resp 18 | Ht 69.0 in | Wt 262.0 lb

## 2016-09-10 DIAGNOSIS — F331 Major depressive disorder, recurrent, moderate: Secondary | ICD-10-CM

## 2016-09-10 DIAGNOSIS — Z79899 Other long term (current) drug therapy: Secondary | ICD-10-CM

## 2016-09-10 DIAGNOSIS — Z818 Family history of other mental and behavioral disorders: Secondary | ICD-10-CM

## 2016-09-10 DIAGNOSIS — Z813 Family history of other psychoactive substance abuse and dependence: Secondary | ICD-10-CM

## 2016-09-10 DIAGNOSIS — F411 Generalized anxiety disorder: Secondary | ICD-10-CM

## 2016-09-10 DIAGNOSIS — Z8249 Family history of ischemic heart disease and other diseases of the circulatory system: Secondary | ICD-10-CM

## 2016-09-10 DIAGNOSIS — Z811 Family history of alcohol abuse and dependence: Secondary | ICD-10-CM

## 2016-09-10 DIAGNOSIS — Z639 Problem related to primary support group, unspecified: Secondary | ICD-10-CM

## 2016-09-10 MED ORDER — ESCITALOPRAM OXALATE 10 MG PO TABS: 10.0000 mg | ORAL_TABLET | Freq: Every day | ORAL | 0 refills | Status: DC

## 2016-09-10 MED ORDER — LAMOTRIGINE 25 MG PO TABS: 25.0000 mg | ORAL_TABLET | Freq: Every day | ORAL | 0 refills | Status: DC
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
## 2016-09-13 ENCOUNTER — Telehealth (HOSPITAL_COMMUNITY): Payer: Self-pay | Admitting: Psychiatry

## 2016-09-13 NOTE — Telephone Encounter (Signed)
Pt states that pharmacy will not fill rx until Dr. Gilmore LarocheAkhtar will give the ok for lamictal and lexapro.

## 2016-09-14 NOTE — Telephone Encounter (Signed)
Medication management- pt phoned into office stating pharmacy would fill prescription for Lexapro and Lamictal. Called [facility], spoke w/ Therapist, nutritionalDebbie (pharmacist). Per Eunice Blaseebbie, pt request a 90 day supply for Lexapro and Lamictal. Per Dr. Gilmore LarocheAkhtar, refill request for a 90 day supply for Lamictal and Lexapro is denied. Medication changes will be discussed at next visit on 11/10. Prescriptions were sent to pharmacy on 10/13 for a 30 day supply for Lexapro and Lamictal. Lvm for to return call to office.

## 2016-09-14 NOTE — Telephone Encounter (Signed)
Medication [facility]ill[facility] spoke w/ Clinical biochemistDebbie (pharmacist). Per Eunice Blaseebbie, pt request a 90 day supply for Lexapro and Lamictal. Per Dr. Gilmore LarocheAkhtar, refill request for a 90 day supply for Lamictal and Lexapro is denied. Medication changes will be discussed at next visit on 11/10. Prescriptions were sent to pharmacy on 10/13 for a 30 day supply for Lexapro and Lamictal. Spoke and informed pt of refill status. Pt verbalizes understanding.

## 2016-09-20 ENCOUNTER — Ambulatory Visit (HOSPITAL_COMMUNITY): Payer: Self-pay | Admitting: Licensed Clinical Social Worker

## 2016-09-28 ENCOUNTER — Ambulatory Visit (INDEPENDENT_AMBULATORY_CARE_PROVIDER_SITE_OTHER): Payer: Federal, State, Local not specified - PPO | Admitting: Licensed Clinical Social Worker

## 2016-09-28 DIAGNOSIS — F331 Major depressive disorder, recurrent, moderate: Secondary | ICD-10-CM

## 2016-09-28 DIAGNOSIS — Z639 Problem related to primary support group, unspecified: Secondary | ICD-10-CM

## 2016-09-28 DIAGNOSIS — F411 Generalized anxiety disorder: Secondary | ICD-10-CM | POA: Diagnosis not present
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
## 2016-10-08 ENCOUNTER — Other Ambulatory Visit (HOSPITAL_COMMUNITY): Payer: Self-pay | Admitting: Psychiatry

## 2016-10-08 ENCOUNTER — Ambulatory Visit (INDEPENDENT_AMBULATORY_CARE_PROVIDER_SITE_OTHER): Payer: Federal, State, Local not specified - PPO | Admitting: Psychiatry

## 2016-10-08 ENCOUNTER — Encounter (HOSPITAL_COMMUNITY): Payer: Self-pay | Admitting: Psychiatry

## 2016-10-08 VITALS — BP 120/72 | HR 71 | Resp 16 | Ht 69.0 in | Wt 268.0 lb

## 2016-10-08 DIAGNOSIS — Z8249 Family history of ischemic heart disease and other diseases of the circulatory system: Secondary | ICD-10-CM

## 2016-10-08 DIAGNOSIS — F331 Major depressive disorder, recurrent, moderate: Secondary | ICD-10-CM

## 2016-10-08 DIAGNOSIS — F411 Generalized anxiety disorder: Secondary | ICD-10-CM

## 2016-10-08 DIAGNOSIS — Z79899 Other long term (current) drug therapy: Secondary | ICD-10-CM

## 2016-10-08 DIAGNOSIS — Z639 Problem related to primary support group, unspecified: Secondary | ICD-10-CM

## 2016-10-08 DIAGNOSIS — Z813 Family history of other psychoactive substance abuse and dependence: Secondary | ICD-10-CM

## 2016-10-08 DIAGNOSIS — Z8489 Family history of other specified conditions: Secondary | ICD-10-CM

## 2016-10-08 DIAGNOSIS — Z811 Family history of alcohol abuse and dependence: Secondary | ICD-10-CM

## 2016-10-08 MED ORDER — LAMOTRIGINE 100 MG PO TABS: 100.0000 mg | ORAL_TABLET | Freq: Every day | ORAL | 1 refills | Status: DC

## 2016-10-08 MED ORDER — ESCITALOPRAM OXALATE 10 MG PO TABS: 10.0000 mg | ORAL_TABLET | Freq: Every day | ORAL | 1 refills | Status: DC
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
## 2016-10-11 NOTE — Telephone Encounter (Signed)
[facility] Lexapro and Lamictal. Per Dr. Gilmore LarocheAkhtar, refill request is denied. Pt was seen in office on 11/10. Refills were sent to pharmacy. Pt is schedule for a f/u apt on 10/26/16.

## 2016-10-23 ENCOUNTER — Encounter: Payer: Self-pay | Admitting: Emergency Medicine

## 2016-10-23 ENCOUNTER — Emergency Department
Admission: EM | Admit: 2016-10-23 | Discharge: 2016-10-23 | Disposition: A | Discharge status: Home / Self Care | Payer: Federal, State, Local not specified - PPO | Source: Home / Self Care | Attending: Family Medicine | Admitting: Family Medicine

## 2016-10-23 DIAGNOSIS — J020 Streptococcal pharyngitis: Secondary | ICD-10-CM

## 2016-10-23 LAB — POCT RAPID STREP A (OFFICE): Rapid Strep A Screen: POSITIVE — AB

## 2016-10-23 MED ORDER — PREDNISONE 20 MG PO TABS: ORAL_TABLET | ORAL | 0 refills | Status: DC

## 2016-10-23 MED ORDER — PENICILLIN V POTASSIUM 500 MG PO TABS: ORAL_TABLET | ORAL | 0 refills | Status: DC

## 2016-10-23 NOTE — ED Provider Notes (Signed)
[facility]    CSN: [id number] Arrival date & time: 10/23/16  1702     History   Chief Complaint Chief Complaint  Patient presents with  . Sore Throat    HPI Colleen Valdez is a 35 y.o. female.   Patient complains of sore throat for two days, and had fever to 99 today.  She has also had a mild cough and nasal congestion.    The history is provided by the patient.    Past Medical History:  Diagnosis Date  . Atrial tachycardia (HCC)   . Chronic headaches     Patient Active Problem List   Diagnosis Date Noted  . Major depressive disorder, recurrent episode, moderate (HCC) 06/10/2016    Past Surgical History:  Procedure Laterality Date  . CESAREAN SECTION      OB History    No data available       Home Medications    Prior to Admission medications   Medication Sig Start Date End Date Taking? Authorizing Provider  escitalopram (LEXAPRO) 10 MG tablet Take 1 tablet (10 mg total) by mouth daily. 10/08/16 10/08/17  Thresa RossNadeem Akhtar, MD  gabapentin (NEURONTIN) 100 MG capsule Take 600 mg by mouth 3 (three) times daily.  07/19/16   Historical Provider, MD  lamoTRIgine (LAMICTAL) 100 MG tablet Take 1 tablet (100 mg total) by mouth daily. Take one tablet daily for a week and then start taking 2 tablets. 10/08/16   Thresa RossNadeem Akhtar, MD  Magnesium 400 MG CAPS Take by mouth.    Historical Provider, MD  nadolol (CORGARD) 40 MG tablet Take 40 mg by mouth daily.    Historical Provider, MD  norethindrone-ethinyl estradiol (MICROGESTIN,JUNEL,LOESTRIN) 1-20 MG-MCG tablet Take 1 tablet by mouth daily. Reported on 06/10/2016    Historical Provider, MD  penicillin v potassium (VEETID) 500 MG tablet Take one tab by mouth twice daily for 10 days 10/23/16   Lattie HawStephen A Beese, MD  predniSONE (DELTASONE) 20 MG tablet Take one tab by mouth twice daily for 5 days, then one daily. Take with food. 10/23/16   Lattie HawStephen A Beese, MD    Family History Family History  Problem Relation Age of  Onset  . Heart attack Father     diagnosed with AntiSocial Personality Disorder  . Alcohol abuse Father   . Drug abuse Father   . Personality disorder Father     Social History Social History  Substance Use Topics  . Smoking status: Never Smoker  . Smokeless tobacco: Never Used  . Alcohol use No     Allergies   Patient has no known allergies.   Review of Systems Review of Systems + sore throat + cough No pleuritic pain No wheezing + nasal congestion + post-nasal drainage No sinus pain/pressure No itchy/red eyes No earache No hemoptysis No SOB + fever, + chills No nausea No vomiting No abdominal pain No diarrhea No urinary symptoms No skin rash + fatigue + myalgias + headache Used OTC meds without relief   Physical Exam Triage Vital Signs ED Triage Vitals  Enc Vitals Group     BP 10/23/16 1723 121/83     Pulse Rate 10/23/16 1723 93     Resp --      Temp 10/23/16 1723 98.3 F (36.8 C)     Temp Source 10/23/16 1723 Oral     SpO2 10/23/16 1723 95 %     Weight 10/23/16 1723 270 lb (122.5 kg)     Height 10/23/16 1723 5'  9" (1.753 m)     Head Circumference --      Peak Flow --      Pain Score 10/23/16 1726 3     Pain Loc --      Pain Edu? --      Excl. in GC? --    No data found.   Updated Vital Signs BP 121/83 (BP Location: Left Arm)   Pulse 93   Temp 98.3 F (36.8 C) (Oral)   Ht 5\' 9"  (1.753 m)   Wt 270 lb (122.5 kg)   SpO2 95%   BMI 39.87 kg/m   Visual Acuity Right Eye Distance:   Left Eye Distance:   Bilateral Distance:    Right Eye Near:   Left Eye Near:    Bilateral Near:     Physical Exam Nursing notes and Vital Signs reviewed. Appearance:  Patient appears stated age, and in no acute distress Eyes:  Pupils are equal, round, and reactive to light and accomodation.  Extraocular movement is intact.  Conjunctivae are not inflamed  Ears:  Canals normal.  Tympanic membranes normal.  Nose:  Mildly congested turbinates.  No sinus  tenderness.   Pharynx:  Erythematous and slightly swollen without obstruction.  Right tonsil is larger than left.  No exudate.  No trismus. Neck:  Supple.  Tender enlarged posterior/lateral nodes are palpated bilaterally.  Tender tonsillar nodes.  Lungs:  Clear to auscultation.  Breath sounds are equal.  Moving air well. Heart:  Regular rate and rhythm without murmurs, rubs, or gallops.  Abdomen:  Nontender without masses or hepatosplenomegaly.  Bowel sounds are present.  No CVA or flank tenderness.  Extremities:  No edema.  Skin:  No rash present.    UC Treatments / Results  Labs (all labs ordered are listed, but only abnormal results are displayed) Labs Reviewed  POCT RAPID STREP A (OFFICE) - Abnormal; Notable for the following:       Result Value   Rapid Strep A Screen Positive (*)    All other components within normal limits    EKG  EKG Interpretation None       Radiology No results found.  Procedures Procedures (including critical care time)  Medications Ordered in UC Medications - No data to display   Initial Impression / Assessment and Plan / UC Course  I have reviewed the triage vital signs and the nursing notes.  Pertinent labs & imaging results that were available during my care of the patient were reviewed by me and considered in my medical decision making (see chart for details).  Clinical Course   Begin Pen VK 500mg  BID, and prednisone burst/taper. Try warm salt water gargles for sore throat.   May have early viral URI. If increasing cold symptoms develop, try the following: Take plain guaifenesin (1200mg  extended release tabs such as Mucinex) twice daily, with plenty of water, for cough and congestion.  May add Pseudoephedrine (30mg , one or two every 4 to 6 hours) for sinus congestion.  Get adequate rest.   May use Afrin nasal spray (or generic oxymetazoline) twice daily for about 5 days and then discontinue.  Also recommend using saline nasal spray  several times daily and saline nasal irrigation (AYR is a common brand).  Use Flonase nasal spray each morning after using Afrin nasal spray and saline nasal irrigation. Stop all antihistamines for now, and other non-prescription cough/cold preparations. May take Delsym Cough Suppressant at bedtime for nighttime cough.  If symptoms become significantly worse  during the night or over the weekend, proceed to the local emergency room.     Final Clinical Impressions(s) / UC Diagnoses   Final diagnoses:  Strep pharyngitis    New Prescriptions New Prescriptions   PENICILLIN V POTASSIUM (VEETID) 500 MG TABLET    Take one tab by mouth twice daily for 10 days   PREDNISONE (DELTASONE) 20 MG TABLET    Take one tab by mouth twice daily for 5 days, then one daily. Take with food.     Lattie HawStephen A Beese, MD 11/03/16 [phone]

## 2016-10-23 NOTE — Discharge Instructions (Signed)
Try warm salt water gargles for sore throat.   If increasing cold symptoms develop, try the following: Take plain guaifenesin (1200mg  extended release tabs such as Mucinex) twice daily, with plenty of water, for cough and congestion.  May add Pseudoephedrine (30mg , one or two every 4 to 6 hours) for sinus congestion.  Get adequate rest.   May use Afrin nasal spray (or generic oxymetazoline) twice daily for about 5 days and then discontinue.  Also recommend using saline nasal spray several times daily and saline nasal irrigation (AYR is a common brand).  Use Flonase nasal spray each morning after using Afrin nasal spray and saline nasal irrigation. Stop all antihistamines for now, and other non-prescription cough/cold preparations. May take Delsym Cough Suppressant at bedtime for nighttime cough.  If symptoms become significantly worse during the night or over the weekend, proceed to the local emergency room.

## 2016-10-23 NOTE — ED Triage Notes (Signed)
Pt c/o sore throat x 2 days, enlarged tonsils w/white patches and left side of neck painful.

## 2016-10-25 ENCOUNTER — Telehealth: Payer: Self-pay | Admitting: Emergency Medicine

## 2016-10-25 NOTE — Telephone Encounter (Signed)
Inquired about patient's status; encourage them to call with questions/concerns.  

## 2016-10-26 ENCOUNTER — Ambulatory Visit (HOSPITAL_COMMUNITY): Payer: Self-pay | Admitting: Licensed Clinical Social Worker

## 2016-11-08 ENCOUNTER — Encounter (HOSPITAL_COMMUNITY): Payer: Self-pay | Admitting: Licensed Clinical Social Worker

## 2016-11-08 ENCOUNTER — Ambulatory Visit (HOSPITAL_COMMUNITY): Payer: Self-pay | Admitting: Licensed Clinical Social Worker

## 2016-12-02 ENCOUNTER — Other Ambulatory Visit (HOSPITAL_COMMUNITY): Payer: Self-pay | Admitting: Psychiatry

## 2016-12-03 ENCOUNTER — Encounter (HOSPITAL_COMMUNITY): Payer: Self-pay | Admitting: Psychiatry

## 2016-12-03 ENCOUNTER — Ambulatory Visit (INDEPENDENT_AMBULATORY_CARE_PROVIDER_SITE_OTHER): Payer: Federal, State, Local not specified - PPO | Admitting: Psychiatry

## 2016-12-03 VITALS — BP 122/74 | HR 77 | Resp 16 | Ht 69.0 in | Wt 267.0 lb

## 2016-12-03 DIAGNOSIS — Z8249 Family history of ischemic heart disease and other diseases of the circulatory system: Secondary | ICD-10-CM

## 2016-12-03 DIAGNOSIS — Z79899 Other long term (current) drug therapy: Secondary | ICD-10-CM

## 2016-12-03 DIAGNOSIS — Z639 Problem related to primary support group, unspecified: Secondary | ICD-10-CM | POA: Diagnosis not present

## 2016-12-03 DIAGNOSIS — F411 Generalized anxiety disorder: Secondary | ICD-10-CM | POA: Diagnosis not present

## 2016-12-03 DIAGNOSIS — F331 Major depressive disorder, recurrent, moderate: Secondary | ICD-10-CM

## 2016-12-03 DIAGNOSIS — Z813 Family history of other psychoactive substance abuse and dependence: Secondary | ICD-10-CM

## 2016-12-03 DIAGNOSIS — Z811 Family history of alcohol abuse and dependence: Secondary | ICD-10-CM

## 2016-12-03 MED ORDER — LAMOTRIGINE 100 MG PO TABS: 100.0000 mg | ORAL_TABLET | Freq: Every day | ORAL | 1 refills | Status: DC

## 2016-12-03 MED ORDER — ESCITALOPRAM OXALATE 10 MG PO TABS: 10.0000 mg | ORAL_TABLET | Freq: Every day | ORAL | 2 refills | Status: DC

## 2016-12-03 NOTE — Progress Notes (Signed)
[facility] Outpatient Follow up visit  Patient Identification: Colleen Valdez MRN:  [id number] Date of Evaluation:  12/03/2016 Referral Source: Venia Minks and Coolidge Breeze Chief Complaint:   Chief Complaint    Follow-up     Visit Diagnosis:    ICD-9-CM ICD-10-CM   1. Major depressive disorder, recurrent episode, moderate (HCC) 296.32 F33.1   2. GAD (generalized anxiety disorder) 300.02 F41.1   3. Relationship problems 313.3 Z63.9     History of Present Illness:  35 years old currently married Caucasian female initially referred by primary care physician and/or counselor for management of depression  Last visit we have added Lamictal now it is at a dose of 10 mg that is helping as augmentation for mood symptoms and depression Relationship remains distant that is a stress factor she is also doing online courses and also working third shift as a Engineer, civil (consulting) so sometimes she does feel stressed out. Anxiety fluctuates but has not worsened  No psychotic symptoms.  Family history of bipolar.    Severity of depression: 7/10. 10 being no depression. Aggravating factors;  H/o diagnosed with SVT, sister's difficult delivery in December Modifying factor:son 77 years of age    Past Psychiatric History: NOne  Previous Psychotropic Medications: No     Past Medical History:  Past Medical History:  Diagnosis Date  . Atrial tachycardia (HCC)   . Chronic headaches     Past Surgical History:  Procedure Laterality Date  . CESAREAN SECTION      Family Psychiatric History: Dad: anxiety and substance abuse  Family History:  Family History  Problem Relation Age of Onset  . Heart attack Father     diagnosed with AntiSocial Personality Disorder  . Alcohol abuse Father   . Drug abuse Father   . Personality disorder Father     Social History:   Social History   Social History  . Marital status: Married    Spouse name: N/A  . Number of children: N/A  . Years of education: N/A   Social  History Main Topics  . Smoking status: Never Smoker  . Smokeless tobacco: Never Used  . Alcohol use No  . Drug use: No  . Sexual activity: Yes   Other Topics Concern  . None   Social History Narrative  . None       Allergies:  No Known Allergies  Metabolic Disorder Labs: No results found for: HGBA1C, MPG No results found for: PROLACTIN No results found for: CHOL, TRIG, HDL, CHOLHDL, VLDL, LDLCALC   Current Medications: Current Outpatient Prescriptions  Medication Sig Dispense Refill  . escitalopram (LEXAPRO) 10 MG tablet Take 1 tablet (10 mg total) by mouth daily. 30 tablet 2  . gabapentin (NEURONTIN) 100 MG capsule Take 600 mg by mouth 3 (three) times daily.     Marland Kitchen lamoTRIgine (LAMICTAL) 100 MG tablet Take 1 tablet (100 mg total) by mouth daily. Take one a day 30 tablet 1  . Magnesium 400 MG CAPS Take by mouth.    . nadolol (CORGARD) 40 MG tablet Take 40 mg by mouth daily.    . norethindrone-ethinyl estradiol (MICROGESTIN,JUNEL,LOESTRIN) 1-20 MG-MCG tablet Take 1 tablet by mouth daily. Reported on 06/10/2016    . penicillin v potassium (VEETID) 500 MG tablet Take one tab by mouth twice daily for 10 days 20 tablet 0  . predniSONE (DELTASONE) 20 MG tablet Take one tab by mouth twice daily for 5 days, then one daily. Take with food. 14 tablet 0   No  current facility-administered medications for this visit.       Psychiatric Specialty Exam: Review of Systems  Cardiovascular: Negative for chest pain.  Skin: Negative for itching and rash.  Psychiatric/Behavioral: Negative for suicidal ideas.    Blood pressure 122/74, pulse 77, resp. rate 16, height 5\' 9"  (1.753 m), weight 267 lb (121.1 kg), last menstrual period 11/09/2016, SpO2 95 %.Body mass index is 39.43 kg/m.  General Appearance: Casual  Eye Contact:  Good  Speech:  Clear and Coherent  Volume:  Normal  Mood:  Somewhat better.   Affect:  Congruent  Thought Process:  Coherent  Orientation:  Full (Time, Place, and  Person)  Thought Content:  Logical  Suicidal Thoughts:  No  Homicidal Thoughts:  No  Memory:  Immediate;   Fair Recent;   Fair  Judgement:  Fair  Insight:  Fair  Psychomotor Activity:  Normal  Concentration:  Concentration: Fair and Attention Span: Fair  Recall:  [employer]Fair  Fund of Knowledge:Fair  Language: Fair  Akathisia:  Negative  Handed:  Right  AIMS (if indicated):    Assets:  Desire for Improvement Social Support  ADL's:  Intact  Cognition: WNL  Sleep:  Variable to fair    Treatment Plan Summary: Medication management and Plan as follows  MDD: improved. Continue lexapro 10mg   and lamictal . No rash Relationship remains distant but not worse Anxiety: manageable. Continue lexapro  Continue therapy of possility of PTSD related to chilhood and contributing to anxiety FU 2-3 months.   Thresa RossAKHTAR, Ardena Gangl, MD 1/5/201812:44 PM

## 2017-01-04 ENCOUNTER — Ambulatory Visit (INDEPENDENT_AMBULATORY_CARE_PROVIDER_SITE_OTHER): Payer: Federal, State, Local not specified - PPO | Admitting: Licensed Clinical Social Worker

## 2017-01-04 DIAGNOSIS — Z639 Problem related to primary support group, unspecified: Secondary | ICD-10-CM

## 2017-01-04 DIAGNOSIS — F411 Generalized anxiety disorder: Secondary | ICD-10-CM | POA: Diagnosis not present

## 2017-01-04 NOTE — Progress Notes (Signed)
   THERAPIST PROGRESS NOTE  Session Time: 9:15am-10:00am  Participation Level: Active  Behavioral Response: CasualAlert Tearful and overwhelmed  Type of Therapy: Individual Therapy  Treatment Goals addressed: Revised treatment plan today- decrease irritability and increase frustration tolerance  Interventions: Assessment and Treatment planning  Suicidal/Homicidal: Denied both  Therapist Interventions:  Met with patient for the first time.   Reviewed recent treatment history and treatment plan.   Collaboratively decided to update goals for her treatment plan.    Summary: Patient explained that she had been seeing Cordella Register for therapy but she missed too many appointments so she was not allowed to schedule any more with that therapist.  She was given an opportunity to continue therapy with this therapist who works within the same office.   Reported a noticeable increase in irritability within the past month or so.  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   Talked about how since she started coming for [facility] services in July she has been opening up more about her thoughts and feelings.  Noted that growing up this wasn't something that was encouraged.  Now understands that it is healthy to talk about these things.    Developed the following new treatment goal: Caryl Pina will report a significant decrease in irritability.  She will also report feeling satisfied with how she is coping with situations that trigger frustration.    Plan: Agreed to aim to schedule therapy appointments approximately once every other week.  Next time may introduce mindfulness.    Diagnosis: Generalized Anxiety Disorder    Armandina Stammer 01/04/2017

## 2017-01-18 ENCOUNTER — Ambulatory Visit (HOSPITAL_COMMUNITY): Payer: Self-pay | Admitting: Licensed Clinical Social Worker

## 2017-01-25 ENCOUNTER — Other Ambulatory Visit (HOSPITAL_COMMUNITY): Payer: Self-pay | Admitting: Psychiatry

## 2017-01-26 NOTE — Telephone Encounter (Signed)
[facility]. Per Dr. Gilmore LarocheAkhtar, refill is authorize for Lamictal 100mg , #30. Rx was sent to pharmacy. Pt's next apt is schedule on 02/22/17. Lvm informing pt of refill status.

## 2017-02-04 ENCOUNTER — Ambulatory Visit (INDEPENDENT_AMBULATORY_CARE_PROVIDER_SITE_OTHER): Payer: Federal, State, Local not specified - PPO | Admitting: Licensed Clinical Social Worker

## 2017-02-04 DIAGNOSIS — F411 Generalized anxiety disorder: Secondary | ICD-10-CM | POA: Diagnosis not present

## 2017-02-04 NOTE — Progress Notes (Signed)
   THERAPIST PROGRESS NOTE  Session Time: 9:00am-9:55am  Participation Level: Active  Behavioral Response: Casual Tired Mostly Euthymic  Type of Therapy: Individual Therapy  Treatment Goals addressed: Decrease irritability and increase frustration tolerance  Interventions: Mindfulness  Suicidal/Homicidal: Denied both  Therapist Interventions: Introduced the concept of mindfulness.  Emphasized how learning to focus on the present can help you to feel more in control of your emotions.  Explained how it can be useful to practice at times when you catch yourself having unhelpful thoughts.  Described how you can choose to do tasks in a mindful way.  Guided patient through practicing mindfulness in different ways including focusing on an object and mindful breathing.  Encouraged patient to practice the skills regularly in addition to times of distress.  Recommended some apps to download for practicing mindfulness.  Summary:  Reported no significant change with her irritability in the past month.  She said "I don't think it has gotten any worse, but no better really either."   Seemed to understand concepts discussed today.  She said "I think it can be really helpful after figuring how to properly do it and when to use it.  It would be good to use anytime I have to interact with my husband."   Indicated she intends to check out the apps the therapist recommended.  Plan: Will schedule next session in 2-3 weeks.  Will check on implementation of mindfulness skills.  Diagnosis: Generalized Anxiety Disorder    Darrin LuisSolomon, Sarah A, LCSW 02/04/2017

## 2017-02-22 ENCOUNTER — Encounter (HOSPITAL_COMMUNITY): Payer: Self-pay | Admitting: Psychiatry

## 2017-02-22 ENCOUNTER — Ambulatory Visit (INDEPENDENT_AMBULATORY_CARE_PROVIDER_SITE_OTHER): Payer: Federal, State, Local not specified - PPO | Admitting: Psychiatry

## 2017-02-22 VITALS — BP 118/72 | HR 82 | Resp 16 | Ht 69.0 in | Wt 278.0 lb

## 2017-02-22 DIAGNOSIS — F411 Generalized anxiety disorder: Secondary | ICD-10-CM

## 2017-02-22 DIAGNOSIS — Z811 Family history of alcohol abuse and dependence: Secondary | ICD-10-CM

## 2017-02-22 DIAGNOSIS — F331 Major depressive disorder, recurrent, moderate: Secondary | ICD-10-CM

## 2017-02-22 DIAGNOSIS — Z79899 Other long term (current) drug therapy: Secondary | ICD-10-CM | POA: Diagnosis not present

## 2017-02-22 DIAGNOSIS — Z813 Family history of other psychoactive substance abuse and dependence: Secondary | ICD-10-CM

## 2017-02-22 DIAGNOSIS — Z639 Problem related to primary support group, unspecified: Secondary | ICD-10-CM | POA: Diagnosis not present

## 2017-02-22 MED ORDER — ESCITALOPRAM OXALATE 10 MG PO TABS: 10.0000 mg | ORAL_TABLET | Freq: Every day | ORAL | 2 refills | Status: DC

## 2017-02-22 MED ORDER — LAMOTRIGINE 100 MG PO TABS: ORAL_TABLET | ORAL | 2 refills | Status: DC
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
## 2017-03-03 ENCOUNTER — Ambulatory Visit (INDEPENDENT_AMBULATORY_CARE_PROVIDER_SITE_OTHER): Payer: Federal, State, Local not specified - PPO | Admitting: Licensed Clinical Social Worker

## 2017-03-03 DIAGNOSIS — F411 Generalized anxiety disorder: Secondary | ICD-10-CM

## 2017-03-03 NOTE — Progress Notes (Signed)
   THERAPIST PROGRESS NOTE  Session Time: 10:05am-10:55am  Participation Level: Active  Behavioral Response: Casual Alert  Euthymic  Type of Therapy: Individual Therapy  Treatment Goals addressed: Decrease irritability and increase frustration tolerance  Interventions: Mindfulness  Suicidal/Homicidal: Denied both  Therapist Interventions: Explored how patient has been incorporating practicing mindfulness into her daily life.  Talked about how she copes with having a different parenting approach than her husband.      Summary:  Described how in the past month she has experienced a noticeable decrease in anxiety and irritability.  Attributed it in large part to making a conscious choice to focus on things that are within her control and let go of things that are not.  Acknowledged there is still quite a bit that frustrates her about how her husband handles issues, but she isn't stressing about those things like she used to.  Mentioned she has been sleeping better.  Feels more energized as a result.        Plan: Agreed to see therapist about once a month as long as she is doing well.    Diagnosis: Generalized Anxiety Disorder    Darrin Luis 03/03/2017

## 2017-04-01 ENCOUNTER — Ambulatory Visit (HOSPITAL_COMMUNITY): Payer: Self-pay | Admitting: Licensed Clinical Social Worker

## 2017-04-26 ENCOUNTER — Ambulatory Visit (HOSPITAL_COMMUNITY): Payer: Self-pay | Admitting: Licensed Clinical Social Worker

## 2017-05-20 ENCOUNTER — Other Ambulatory Visit (HOSPITAL_COMMUNITY): Payer: Self-pay | Admitting: Psychiatry

## 2017-05-20 NOTE — Telephone Encounter (Signed)
[facility] Lam[facility]al. Per Dr. Gilmore LarocheAkhtar, refill is authorize for Lamictal 100mg , #30. Rx was sent to pharmacy. Pt's next apt is schedule on 06/07/17. Lvm informing pt of refill status.

## 2017-05-23 NOTE — Telephone Encounter (Signed)
[facility] a 90 day refill for Lamictal. Per Dr. Gilmore LarocheAkhtar, refill request is denied. A 30 days supply of Lamictal was sent to pharmacy on 05/20/17. Pt's next apt is schedule on 06/07/17. Nothing further is need at this time.

## 2017-05-27 ENCOUNTER — Other Ambulatory Visit (HOSPITAL_COMMUNITY): Payer: Self-pay | Admitting: Psychiatry

## 2017-05-30 ENCOUNTER — Other Ambulatory Visit (HOSPITAL_COMMUNITY): Payer: Self-pay | Admitting: Psychiatry

## 2017-05-30 NOTE — Telephone Encounter (Signed)
Medication refill- [facility]. Per Dr. Gilmore LarocheAkhtar, refill is authorize for Lexapro 10mg , #30. Rx was sent to pharmacy. Pt's next apt is schedule on 06/07/17. Lvm informing pt of refill status.

## 2017-05-31 NOTE — Telephone Encounter (Signed)
[facility] a 90 day refill for Lexapro. Per Dr. Gilmore LarocheAkhtar, refill request is denied. A 30 days supply of Lexapro was sent to pharmacy on 05/30/17. Pt's next apt is schedule on 06/07/17. Nothing further is need at this time.

## 2017-06-07 ENCOUNTER — Ambulatory Visit (INDEPENDENT_AMBULATORY_CARE_PROVIDER_SITE_OTHER): Payer: Federal, State, Local not specified - PPO | Admitting: Psychiatry

## 2017-06-07 ENCOUNTER — Encounter (HOSPITAL_COMMUNITY): Payer: Self-pay | Admitting: Psychiatry

## 2017-06-07 VITALS — BP 122/76 | HR 92 | Resp 16 | Ht 69.0 in | Wt 275.0 lb

## 2017-06-07 DIAGNOSIS — Z639 Problem related to primary support group, unspecified: Secondary | ICD-10-CM

## 2017-06-07 DIAGNOSIS — Z811 Family history of alcohol abuse and dependence: Secondary | ICD-10-CM

## 2017-06-07 DIAGNOSIS — Z813 Family history of other psychoactive substance abuse and dependence: Secondary | ICD-10-CM

## 2017-06-07 DIAGNOSIS — F331 Major depressive disorder, recurrent, moderate: Secondary | ICD-10-CM | POA: Diagnosis not present

## 2017-06-07 DIAGNOSIS — F411 Generalized anxiety disorder: Secondary | ICD-10-CM | POA: Diagnosis not present

## 2017-06-07 DIAGNOSIS — Z818 Family history of other mental and behavioral disorders: Secondary | ICD-10-CM

## 2017-06-07 MED ORDER — LAMOTRIGINE 100 MG PO TABS: ORAL_TABLET | ORAL | 1 refills | Status: DC

## 2017-06-07 MED ORDER — ESCITALOPRAM OXALATE 10 MG PO TABS: 15.0000 mg | ORAL_TABLET | Freq: Every day | ORAL | 1 refills | Status: DC

## 2017-06-07 NOTE — Progress Notes (Signed)
[facility] Outpatient Follow up visit  Patient Identification: Liba Hulsey MRN:  [id number] Date of Evaluation:  06/07/2017 Referral Source: Venia Minks and Coolidge Breeze Chief Complaint:   Chief Complaint    Follow-up     Visit Diagnosis:    ICD-10-CM   1. Major depressive disorder, recurrent episode, moderate (HCC) F33.1   2. GAD (generalized anxiety disorder) F41.1   3. Relationship problems Z63.9     History of Present Illness:  36 years old currently married Caucasian female initially referred by primary care physician and/or counselor for management of depression  Relationship remains a concern says it is toxic. She otherwise works night shift and has a son to take care of. She is tolerating medication and is feeling somewhat down at times or irritable. Anxiety is manageable   No psychosis  Modifying factors son  Severity of depression: 7/10 Aggravating factors;  H/o diagnosed with SVT, relationship concerns     Past Psychiatric History: NOne  Previous Psychotropic Medications: No     Past Medical History:  Past Medical History:  Diagnosis Date  . Atrial tachycardia (HCC)   . Chronic headaches     Past Surgical History:  Procedure Laterality Date  . CESAREAN SECTION      Family Psychiatric History: Dad: anxiety and substance abuse  Family History:  Family History  Problem Relation Age of Onset  . Heart attack Father        diagnosed with AntiSocial Personality Disorder  . Alcohol abuse Father   . Drug abuse Father   . Personality disorder Father     Social History:   Social History   Social History  . Marital status: Married    Spouse name: N/A  . Number of children: N/A  . Years of education: N/A   Social History Main Topics  . Smoking status: Never Smoker  . Smokeless tobacco: Never Used  . Alcohol use No  . Drug use: No  . Sexual activity: Yes   Other Topics Concern  . None   Social History Narrative  . None       Allergies:   No Known Allergies  Metabolic Disorder Labs: No results found for: HGBA1C, MPG No results found for: PROLACTIN No results found for: CHOL, TRIG, HDL, CHOLHDL, VLDL, LDLCALC   Current Medications: Current Outpatient Prescriptions  Medication Sig Dispense Refill  . escitalopram (LEXAPRO) 10 MG tablet Take 1.5 tablets (15 mg total) by mouth daily. 45 tablet 1  . gabapentin (NEURONTIN) 100 MG capsule Take 900 mg by mouth 2 (two) times daily.     Marland Kitchen lamoTRIgine (LAMICTAL) 100 MG tablet TAKE 1 TABLET(100 MG) BY MOUTH EVERY DAY 30 tablet 1  . Magnesium 400 MG CAPS Take by mouth.    . nadolol (CORGARD) 40 MG tablet Take 40 mg by mouth daily.     No current facility-administered medications for this visit.       Psychiatric Specialty Exam: Review of Systems  Cardiovascular: Negative for chest pain and palpitations.  Skin: Negative for itching and rash.  Psychiatric/Behavioral: Positive for depression. Negative for suicidal ideas.    Blood pressure 122/76, pulse 92, resp. rate 16, height 5\' 9"  (1.753 m), weight 275 lb (124.7 kg), SpO2 95 %.Body mass index is 40.61 kg/m.  General Appearance: Casual  Eye Contact:  Good  Speech:  Clear and Coherent  Volume:  Normal  Mood:  Fair but gets down or edgy  Affect:  congruent  Thought Process:  Coherent  Orientation:  Full (Time, Place, and Person)  Thought Content:  Logical  Suicidal Thoughts:  No  Homicidal Thoughts:  No  Memory:  Immediate;   Fair Recent;   Fair  Judgement:  Fair  Insight:  Fair  Psychomotor Activity:  Normal  Concentration:  Concentration: Fair and Attention Span: Fair  Recall:  [employer]Fair  Fund of Knowledge:Fair  Language: Fair  Akathisia:  Negative  Handed:  Right  AIMS (if indicated):    Assets:  Desire for Improvement Social Support  ADL's:  Intact  Cognition: WNL  Sleep:  Variable to fair    Treatment Plan Summary: Medication management and Plan as follows  MDD: somewhat frustrated. Increase lexapro 15mg .    Relationship : distant effects mood. Increase lexapro consider therapy  Anxiety: baseline. Continue lexapro Questions addressed. Increase activities to distract from worries and relationship FU 2-3 months or early if needed  Thresa RossAKHTAR, Tamel Abel, MD 7/10/20189:09 AM

## 2017-06-27 ENCOUNTER — Ambulatory Visit (INDEPENDENT_AMBULATORY_CARE_PROVIDER_SITE_OTHER): Payer: Federal, State, Local not specified - PPO | Admitting: Licensed Clinical Social Worker

## 2017-06-27 DIAGNOSIS — Z639 Problem related to primary support group, unspecified: Secondary | ICD-10-CM | POA: Diagnosis not present

## 2017-06-27 DIAGNOSIS — F411 Generalized anxiety disorder: Secondary | ICD-10-CM

## 2017-06-27 DIAGNOSIS — F331 Major depressive disorder, recurrent, moderate: Secondary | ICD-10-CM

## 2017-06-27 NOTE — Progress Notes (Signed)
   THERAPIST PROGRESS NOTE  Session Time: 9:04am-10:03am  Participation Level: Active  Behavioral Response: Casual Alert  Tearful Overwhelmed  Type of Therapy: Individual Therapy  Treatment Goals addressed: Decrease irritability and increase frustration tolerance  Interventions: Assessment, solution-focused  Suicidal/Homicidal: Denied both  Therapist Interventions: Gathered information about significant events and changes in mood and functioning since last seen in April. Discussed how patient has decided to separate from her husband.  Provided positive feedback regarding how she is taking action to create change rather than continue on with the status quo.     Recommended a resource to patient called [employer].  Explained how she might use the interventions to reduce some of the stress in her life that comes from parenting.   Reviewed treatment plan.  Collaboratively decided to keep working on the treatment goal to reduce irritability and increase frustration tolerance.   Summary:  Reported experiencing ongoing irritability and frustration.  Noted that practicing mindfulness seemed to help for a while, but she got out of the practice and has struggled to make it a regular part of her routine again.   Talked about how she feels stuck in her relationship with her husband.  She continuously expresses to him how she feels and what she believes will help to ease some of her stress.  He does not provide validation of her experience and makes little effort to change his behavior.  This of course only adds to her frustration.  Indicated she has made up her mind to separate.  She just needs to figure out where she will go.  Notes that she has family support.       Plan: Recommended scheduling next appointment in approximately 2 weeks.  Diagnosis: Generalized Anxiety Disorder    Darrin LuisSolomon, Konstantinos Cordoba A, LCSW 06/27/2017

## 2017-07-12 ENCOUNTER — Ambulatory Visit (INDEPENDENT_AMBULATORY_CARE_PROVIDER_SITE_OTHER): Payer: Federal, State, Local not specified - PPO | Admitting: Licensed Clinical Social Worker

## 2017-07-12 DIAGNOSIS — F411 Generalized anxiety disorder: Secondary | ICD-10-CM

## 2017-07-12 NOTE — Progress Notes (Signed)
   THERAPIST PROGRESS NOTE  Session Time: 9:09am-10:03am  Participation Level: Active  Behavioral Response: Casual Alert  Frustrated  Type of Therapy: Individual Therapy  Treatment Goals addressed: Decrease irritability and increase frustration tolerance  Interventions: Assessment, solution-focused  Suicidal/Homicidal: Denied both  Therapist Interventions: Processed thoughts and feelings about her first session of couples counseling.  Challenged patient to consider whether she is going into interactions with her husband with an open mind or dwelling on past negative interactions.  Encouraged her to slow down before she responds and notice how her body feels and what thoughts are going on.   Discussed how her husband seems to have problems giving up control.  Talked about how in general he does not engage in activities for relaxation.        Summary:  Reported that her husband scheduled an appointment for them to see a therapist.  They had their first session last Friday.  Admitted that she came away from it still feeling very frustrated.  Therapist assigned homework to talk to each other about anything and practice listening.  She says she is waiting for her husband to initiate this.   Acknowledged that she is having a hard time staying in the present when she has interactions with her husband.  She automatically thinks about past interactions they have had that did not go well.  Says she is going to work on being more present.  Had decided to postpone separating.  Going to attend a few more therapy sessions before she goes that route.         Plan: Recommended scheduling next appointment in approximately 2 weeks.  Diagnosis: Generalized Anxiety Disorder    Darrin LuisSolomon, Sarah A, LCSW 07/12/2017

## 2017-08-02 ENCOUNTER — Ambulatory Visit (HOSPITAL_COMMUNITY): Payer: Self-pay | Admitting: Licensed Clinical Social Worker

## 2017-08-03 ENCOUNTER — Encounter (HOSPITAL_COMMUNITY): Payer: Self-pay | Admitting: Psychiatry

## 2017-08-03 ENCOUNTER — Ambulatory Visit (INDEPENDENT_AMBULATORY_CARE_PROVIDER_SITE_OTHER): Payer: Federal, State, Local not specified - PPO | Admitting: Psychiatry

## 2017-08-03 VITALS — BP 126/72 | HR 86 | Resp 16 | Ht 69.0 in | Wt 275.0 lb

## 2017-08-03 DIAGNOSIS — F331 Major depressive disorder, recurrent, moderate: Secondary | ICD-10-CM

## 2017-08-03 DIAGNOSIS — Z630 Problems in relationship with spouse or partner: Secondary | ICD-10-CM | POA: Diagnosis not present

## 2017-08-03 DIAGNOSIS — Z811 Family history of alcohol abuse and dependence: Secondary | ICD-10-CM | POA: Diagnosis not present

## 2017-08-03 DIAGNOSIS — Z818 Family history of other mental and behavioral disorders: Secondary | ICD-10-CM

## 2017-08-03 DIAGNOSIS — F411 Generalized anxiety disorder: Secondary | ICD-10-CM

## 2017-08-03 DIAGNOSIS — Z813 Family history of other psychoactive substance abuse and dependence: Secondary | ICD-10-CM | POA: Diagnosis not present

## 2017-08-03 DIAGNOSIS — Z79899 Other long term (current) drug therapy: Secondary | ICD-10-CM

## 2017-08-03 DIAGNOSIS — Z639 Problem related to primary support group, unspecified: Secondary | ICD-10-CM

## 2017-08-03 MED ORDER — ESCITALOPRAM OXALATE 10 MG PO TABS: 15.0000 mg | ORAL_TABLET | Freq: Every day | ORAL | 1 refills | Status: DC

## 2017-08-03 MED ORDER — LAMOTRIGINE 100 MG PO TABS: ORAL_TABLET | ORAL | 1 refills | Status: DC

## 2017-08-03 NOTE — Progress Notes (Signed)
[facility]  Patient Identification: Colleen Valdez MRN:  [id number] Date of Evaluation:  08/03/2017 Referral Source: Venia Minksonnay Elkins and Coolidge BreezeMary Bowman Chief Complaint:   Chief Complaint    Follow-up     Visit Diagnosis:    ICD-10-CM   1. GAD (generalized anxiety disorder) F41.1   2. Major depressive disorder, recurrent episode, moderate (HCC) F33.1   3. Relationship problems Z63.9     History of Present Illness:  36 years old currently married Caucasian female initially referred by primary care physician and/or counselor for management of depression  Relationship remains a concern she is doing couples therapy but he remains a quiet person but that is toxic so at least the therapy she tries to let her parts out that is helping some increase in Lexapro is also helped she continues to work . Anxiety is manageable   No psychosis  Modifying factors: son  Severity of depression: 7.5/10 Aggravating factors;  H/o diagnosed with SVT, relationship concerns    Past Psychiatric History: NOne  Previous Psychotropic Medications: No     Past Medical History:  Past Medical History:  Diagnosis Date  . Atrial tachycardia (HCC)   . Chronic headaches     Past Surgical History:  Procedure Laterality Date  . CESAREAN SECTION      Family Psychiatric History: Dad: anxiety and substance abuse  Family History:  Family History  Problem Relation Age of Onset  . Heart attack Father        diagnosed with AntiSocial Personality Disorder  . Alcohol abuse Father   . Drug abuse Father   . Personality disorder Father     Social History:   Social History   Social History  . Marital status: Married    Spouse name: N/A  . Number of children: N/A  . Years of education: N/A   Social History Main Topics  . Smoking status: Never Smoker  . Smokeless tobacco: Never Used  . Alcohol use No  . Drug use: No  . Sexual activity: Yes    Partners: Male    Birth control/  protection: Injection   Other Topics Concern  . None   Social History Narrative  . None       Allergies:  No Known Allergies  Metabolic Disorder Labs: No results found for: HGBA1C, MPG No results found for: PROLACTIN No results found for: CHOL, TRIG, HDL, CHOLHDL, VLDL, LDLCALC   Current Medications: Current Outpatient Prescriptions  Medication Sig Dispense Refill  . escitalopram (LEXAPRO) 10 MG tablet Take 1.5 tablets (15 mg total) by mouth daily. 45 tablet 1  . gabapentin (NEURONTIN) 100 MG capsule Take 900 mg by mouth 2 (two) times daily.     Marland Kitchen. lamoTRIgine (LAMICTAL) 100 MG tablet TAKE 1 TABLET(100 MG) BY MOUTH EVERY DAY 30 tablet 1  . Magnesium 400 MG CAPS Take by mouth.    . nadolol (CORGARD) 40 MG tablet Take 40 mg by mouth daily.     No current facility-administered medications for this visit.       Psychiatric Specialty Exam: Review of Systems  Cardiovascular: Negative for chest pain and palpitations.  Musculoskeletal: Positive for joint pain.  Skin: Negative for itching and rash.  Neurological: Negative for tremors.  Psychiatric/Behavioral: Negative for suicidal ideas.    Blood pressure 126/72, pulse 86, resp. rate 16, height 5\' 9"  (1.753 m), weight 275 lb (124.7 kg), SpO2 94 %.Body mass index is 40.61 kg/m.  General Appearance: Casual  Eye Contact:  Good  Speech:  Clear and Coherent  Volume:  Normal  Mood: less edgy  Affect:  Reactive   Thought Process:  Coherent  Orientation:  Full (Time, Place, and Person)  Thought Content:  Logical  Suicidal Thoughts:  No  Homicidal Thoughts:  No  Memory:  Immediate;   Fair Recent;   Fair  Judgement:  Fair  Insight:  Fair  Psychomotor Activity:  Normal  Concentration:  Concentration: Fair and Attention Span: Fair  Recall:  [employer] of Knowledge:Fair  Language: Fair  Akathisia:  Negative  Handed:  Right  AIMS (if indicated):    Assets:  Desire for Improvement Social Support  ADL's:  Intact   Cognition: WNL  Sleep:  Variable to fair    Treatment Plan Summary: Medication management and Plan as follows  MDD: some improvement. Continue lamcitatl and lexapro.  Relationship : distant. In therapy now. Some more communication Anxiety: baseline Questions addressed no rash side effects. Continue therapy follow up for med review in 2 months continue Lexapro 15 mg  Thresa Ross, MD 9/5/20188:47 AM

## 2017-08-17 ENCOUNTER — Ambulatory Visit (INDEPENDENT_AMBULATORY_CARE_PROVIDER_SITE_OTHER): Payer: Federal, State, Local not specified - PPO | Admitting: Licensed Clinical Social Worker

## 2017-08-17 DIAGNOSIS — Z639 Problem related to primary support group, unspecified: Secondary | ICD-10-CM

## 2017-08-17 DIAGNOSIS — F411 Generalized anxiety disorder: Secondary | ICD-10-CM | POA: Diagnosis not present

## 2017-08-17 NOTE — Progress Notes (Signed)
   THERAPIST PROGRESS NOTE  Session Time: 9:00am-9:53am  Participation Level: Active  Behavioral Response: Casual Alert  Mostly Euthymic  Type of Therapy: Individual Therapy  Treatment Goals addressed: Decrease irritability and increase frustration tolerance  Interventions: Assessment, validation of thoughts and feelings  Suicidal/Homicidal: Denied both  Therapist Interventions:  Asked patient about her experience in couples counseling so far.     Discussed how there has been a shift in her behavior when it comes to making her wants and needs a priority.  Prompted her to comment on how prioritizing self-care has impacted her and the rest of her family.       Summary:  Reported that she has only attended two couples therapy appointments.  Husband has been making appointments without her input on what works for her, so he has ended up having some individual therapy appointments.  Indicated she is supportive of him doing this.  York Spaniel that she was very honest about the fact that she doesn't anticipate them resolving things in their relationship.    Talked about how she has been spending a good part of the week away from home because she is doing a Scientist, product/process development for grad school.  She has been offered a job there and has accepted it.  She is excited about taking advantage of these opportunities.  She also talked about how compared to a few months ago she is devoting some time towards pursuing her own interests.  Reflecting on self-care she said, "I'm not as stressed.  Unlike before when I spend time with my son I am able to be present and get some enjoyment from that time."            Plan: Collaboratively decided to schedule next therapy appointment in approximately one month.  Diagnosis: Generalized Anxiety Disorder    Darrin Luis 08/17/2017

## 2017-08-31 ENCOUNTER — Ambulatory Visit (HOSPITAL_COMMUNITY): Payer: Self-pay | Admitting: Licensed Clinical Social Worker

## 2017-09-21 ENCOUNTER — Ambulatory Visit (HOSPITAL_COMMUNITY): Payer: Self-pay | Admitting: Licensed Clinical Social Worker

## 2017-09-28 ENCOUNTER — Ambulatory Visit (HOSPITAL_COMMUNITY): Payer: Self-pay | Admitting: Psychiatry

## 2017-10-15 ENCOUNTER — Other Ambulatory Visit (HOSPITAL_COMMUNITY): Payer: Self-pay | Admitting: Psychiatry

## 2017-10-19 ENCOUNTER — Other Ambulatory Visit (HOSPITAL_COMMUNITY): Payer: Self-pay | Admitting: Psychiatry

## 2017-11-08 ENCOUNTER — Other Ambulatory Visit (HOSPITAL_COMMUNITY): Payer: Self-pay | Admitting: Psychiatry

## 2017-11-08 ENCOUNTER — Other Ambulatory Visit (HOSPITAL_COMMUNITY): Payer: Self-pay

## 2017-11-08 MED ORDER — LAMOTRIGINE 100 MG PO TABS: ORAL_TABLET | ORAL | 0 refills | Status: DC

## 2017-11-08 MED ORDER — ESCITALOPRAM OXALATE 10 MG PO TABS: 15.0000 mg | ORAL_TABLET | Freq: Every day | ORAL | 0 refills | Status: DC

## 2017-11-08 MED ORDER — LAMOTRIGINE 100 MG PO TABS: 100.0000 mg | ORAL_TABLET | Freq: Every day | ORAL | 0 refills | Status: DC

## 2017-11-08 MED ORDER — ESCITALOPRAM OXALATE 10 MG PO TABS
15.0000 mg | ORAL_TABLET | Freq: Every day | ORAL | 0 refills | Status: DC
Start: 2017-11-08 — End: 2017-11-14

## 2017-11-08 NOTE — Telephone Encounter (Signed)
Can send if due or be out before 12/20

## 2017-11-08 NOTE — Telephone Encounter (Signed)
Pt made an appt on 12/20. Pt needs refills sent to [facility] in [place].  Please advise.

## 2017-11-08 NOTE — Telephone Encounter (Signed)
Sent in a one month supply of Lamictal and Lexapro to [facility]. Nothing further is needed at this time.

## 2017-11-14 ENCOUNTER — Other Ambulatory Visit: Payer: Self-pay

## 2017-11-14 ENCOUNTER — Ambulatory Visit (HOSPITAL_COMMUNITY): Payer: Federal, State, Local not specified - PPO | Admitting: Psychiatry

## 2017-11-14 ENCOUNTER — Encounter (HOSPITAL_COMMUNITY): Payer: Self-pay | Admitting: Psychiatry

## 2017-11-14 VITALS — BP 116/88 | HR 98 | Ht 69.0 in | Wt 275.0 lb

## 2017-11-14 DIAGNOSIS — Z811 Family history of alcohol abuse and dependence: Secondary | ICD-10-CM | POA: Diagnosis not present

## 2017-11-14 DIAGNOSIS — F411 Generalized anxiety disorder: Secondary | ICD-10-CM | POA: Diagnosis not present

## 2017-11-14 DIAGNOSIS — Z639 Problem related to primary support group, unspecified: Secondary | ICD-10-CM | POA: Diagnosis not present

## 2017-11-14 DIAGNOSIS — Z813 Family history of other psychoactive substance abuse and dependence: Secondary | ICD-10-CM

## 2017-11-14 DIAGNOSIS — F331 Major depressive disorder, recurrent, moderate: Secondary | ICD-10-CM

## 2017-11-14 MED ORDER — ESCITALOPRAM OXALATE 10 MG PO TABS: 15.0000 mg | ORAL_TABLET | Freq: Every day | ORAL | 1 refills | Status: AC

## 2017-11-14 MED ORDER — LAMOTRIGINE 100 MG PO TABS: 100.0000 mg | ORAL_TABLET | Freq: Every day | ORAL | 1 refills | Status: DC

## 2017-11-14 NOTE — Progress Notes (Signed)
[facility]  Patient Identification: Colleen Valdez MRN:  [id number] Date of Evaluation:  11/14/2017 Referral Source: Venia Minksonnay Elkins and Coolidge BreezeMary Bowman Chief Complaint:   Chief Complaint    Follow-up; Anxiety     Visit Diagnosis:    ICD-10-CM   1. GAD (generalized anxiety disorder) F41.1   2. Relationship problems Z63.9   3. Major depressive disorder, recurrent episode, moderate (HCC) F33.1     History of Present Illness:  36 years old currently married Caucasian female initially referred by primary care physician and/or counselor for management of depression  Has moved out from a toxic relationship. He was mostly quite Has injured due to planta fasciitis so in cast   Mood fair.     Modifying factors: son  Severity of depression: 7.5/10 Aggravating factors; relationship in past. SVT Timing when in pain   Past Psychiatric History: NOne  Previous Psychotropic Medications: No     Past Medical History:  Past Medical History:  Diagnosis Date  . Atrial tachycardia (HCC)   . Chronic headaches     Past Surgical History:  Procedure Laterality Date  . CESAREAN SECTION      Family Psychiatric History: Dad: anxiety and substance abuse  Family History:  Family History  Problem Relation Age of Onset  . Heart attack Father        diagnosed with AntiSocial Personality Disorder  . Alcohol abuse Father   . Drug abuse Father   . Personality disorder Father     Social History:   Social History   Socioeconomic History  . Marital status: Married    Spouse name: None  . Number of children: None  . Years of education: None  . Highest education level: None  Social Needs  . Financial resource strain: None  . Food insecurity - worry: None  . Food insecurity - inability: None  . Transportation needs - medical: None  . Transportation needs - non-medical: None  Occupational History  . None  Tobacco Use  . Smoking status: Never Smoker  . Smokeless  tobacco: Never Used  Substance and Sexual Activity  . Alcohol use: No  . Drug use: No  . Sexual activity: Yes    Partners: Male    Birth control/protection: Injection  Other Topics Concern  . None  Social History Narrative  . None       Allergies:  No Known Allergies  Metabolic Disorder Labs: No results found for: HGBA1C, MPG No results found for: PROLACTIN No results found for: CHOL, TRIG, HDL, CHOLHDL, VLDL, LDLCALC   Current Medications: Current Outpatient Medications  Medication Sig Dispense Refill  . escitalopram (LEXAPRO) 10 MG tablet Take 1.5 tablets (15 mg total) by mouth daily. 45 tablet 1  . gabapentin (NEURONTIN) 100 MG capsule Take 900 mg by mouth 2 (two) times daily.     Marland Kitchen. lamoTRIgine (LAMICTAL) 100 MG tablet Take 1 tablet (100 mg total) by mouth daily. 30 tablet 1  . Magnesium 400 MG CAPS Take by mouth.    . nadolol (CORGARD) 40 MG tablet Take 40 mg by mouth daily.     No current facility-administered medications for this visit.       Psychiatric Specialty Exam: Review of Systems  Cardiovascular: Negative for palpitations.  Skin: Negative for itching and rash.  Neurological: Negative for tremors.  Psychiatric/Behavioral: Negative for depression.    Blood pressure 116/88, pulse 98, height 5\' 9"  (1.753 m), weight 275 lb (124.7 kg).Body mass index is 40.61 kg/m.  General Appearance: Casual  Eye Contact:  Good  Speech:  Clear and Coherent  Volume:  Normal  Mood: fair  Affect: reactive  Thought Process:  Coherent  Orientation:  Full (Time, Place, and Person)  Thought Content:  Logical  Suicidal Thoughts:  No  Homicidal Thoughts:  No  Memory:  Immediate;   Fair Recent;   Fair  Judgement:  Fair  Insight:  Fair  Psychomotor Activity:  Normal  Concentration:  Concentration: Fair and Attention Span: Fair  Recall:  [employer]Fair  Fund of Knowledge:Fair  Language: Fair  Akathisia:  Negative  Handed:  Right  AIMS (if indicated):    Assets:  Desire for  Improvement Social Support  ADL's:  Intact  Cognition: WNL  Sleep:  Variable to fair    Treatment Plan Summary: Medication management and Plan as follows  MDD: not worse. Now living by herself. Continue lamictal. lexapro Relationship :moved out. dealting ok Anxiety: baseline . Using crutches effects anxiety.  Continue lexapro Fu 3 months. Questions addressed.  Thresa RossNadeem Virgil Lightner, MD 12/17/20189:38 AM

## 2017-11-17 ENCOUNTER — Ambulatory Visit (HOSPITAL_COMMUNITY): Payer: Self-pay | Admitting: Psychiatry

## 2017-12-07 ENCOUNTER — Other Ambulatory Visit (HOSPITAL_COMMUNITY): Payer: Self-pay | Admitting: Psychiatry

## 2018-01-02 ENCOUNTER — Other Ambulatory Visit (HOSPITAL_COMMUNITY): Payer: Self-pay

## 2018-01-02 ENCOUNTER — Other Ambulatory Visit (HOSPITAL_COMMUNITY): Payer: Self-pay | Admitting: Psychiatry

## 2018-01-02 MED ORDER — LAMOTRIGINE 100 MG PO TABS: 100.0000 mg | ORAL_TABLET | Freq: Every day | ORAL | 1 refills | Status: AC

## 2018-01-07 IMAGING — DX DG CHEST 2V
2 series · 2 of 2 positions shown · non-contrast
Comparison: None available

CLINICAL DATA: Productive cough and fever for 1 week

EXAM:
CHEST  2 VIEW

[chest pa]
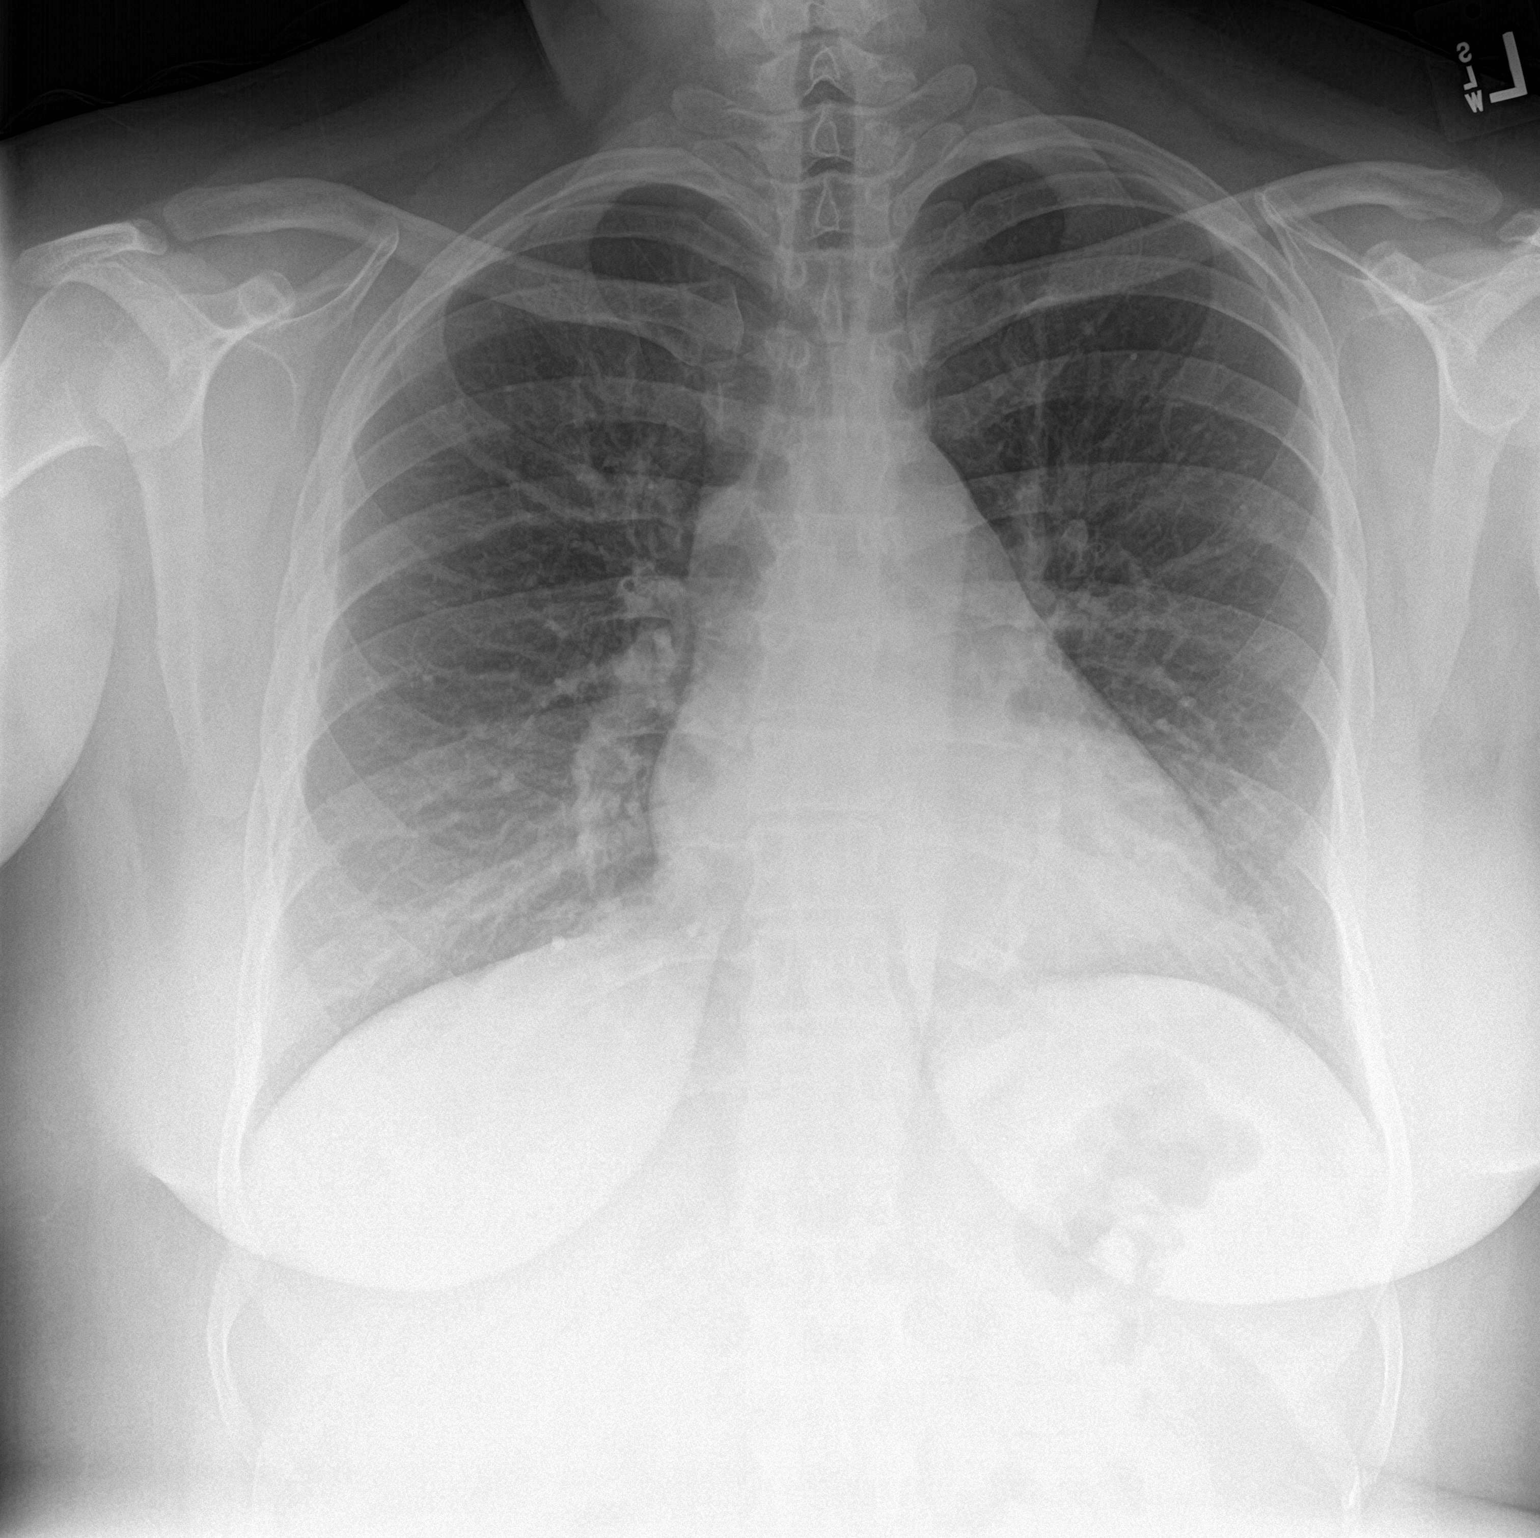

[chest lat]
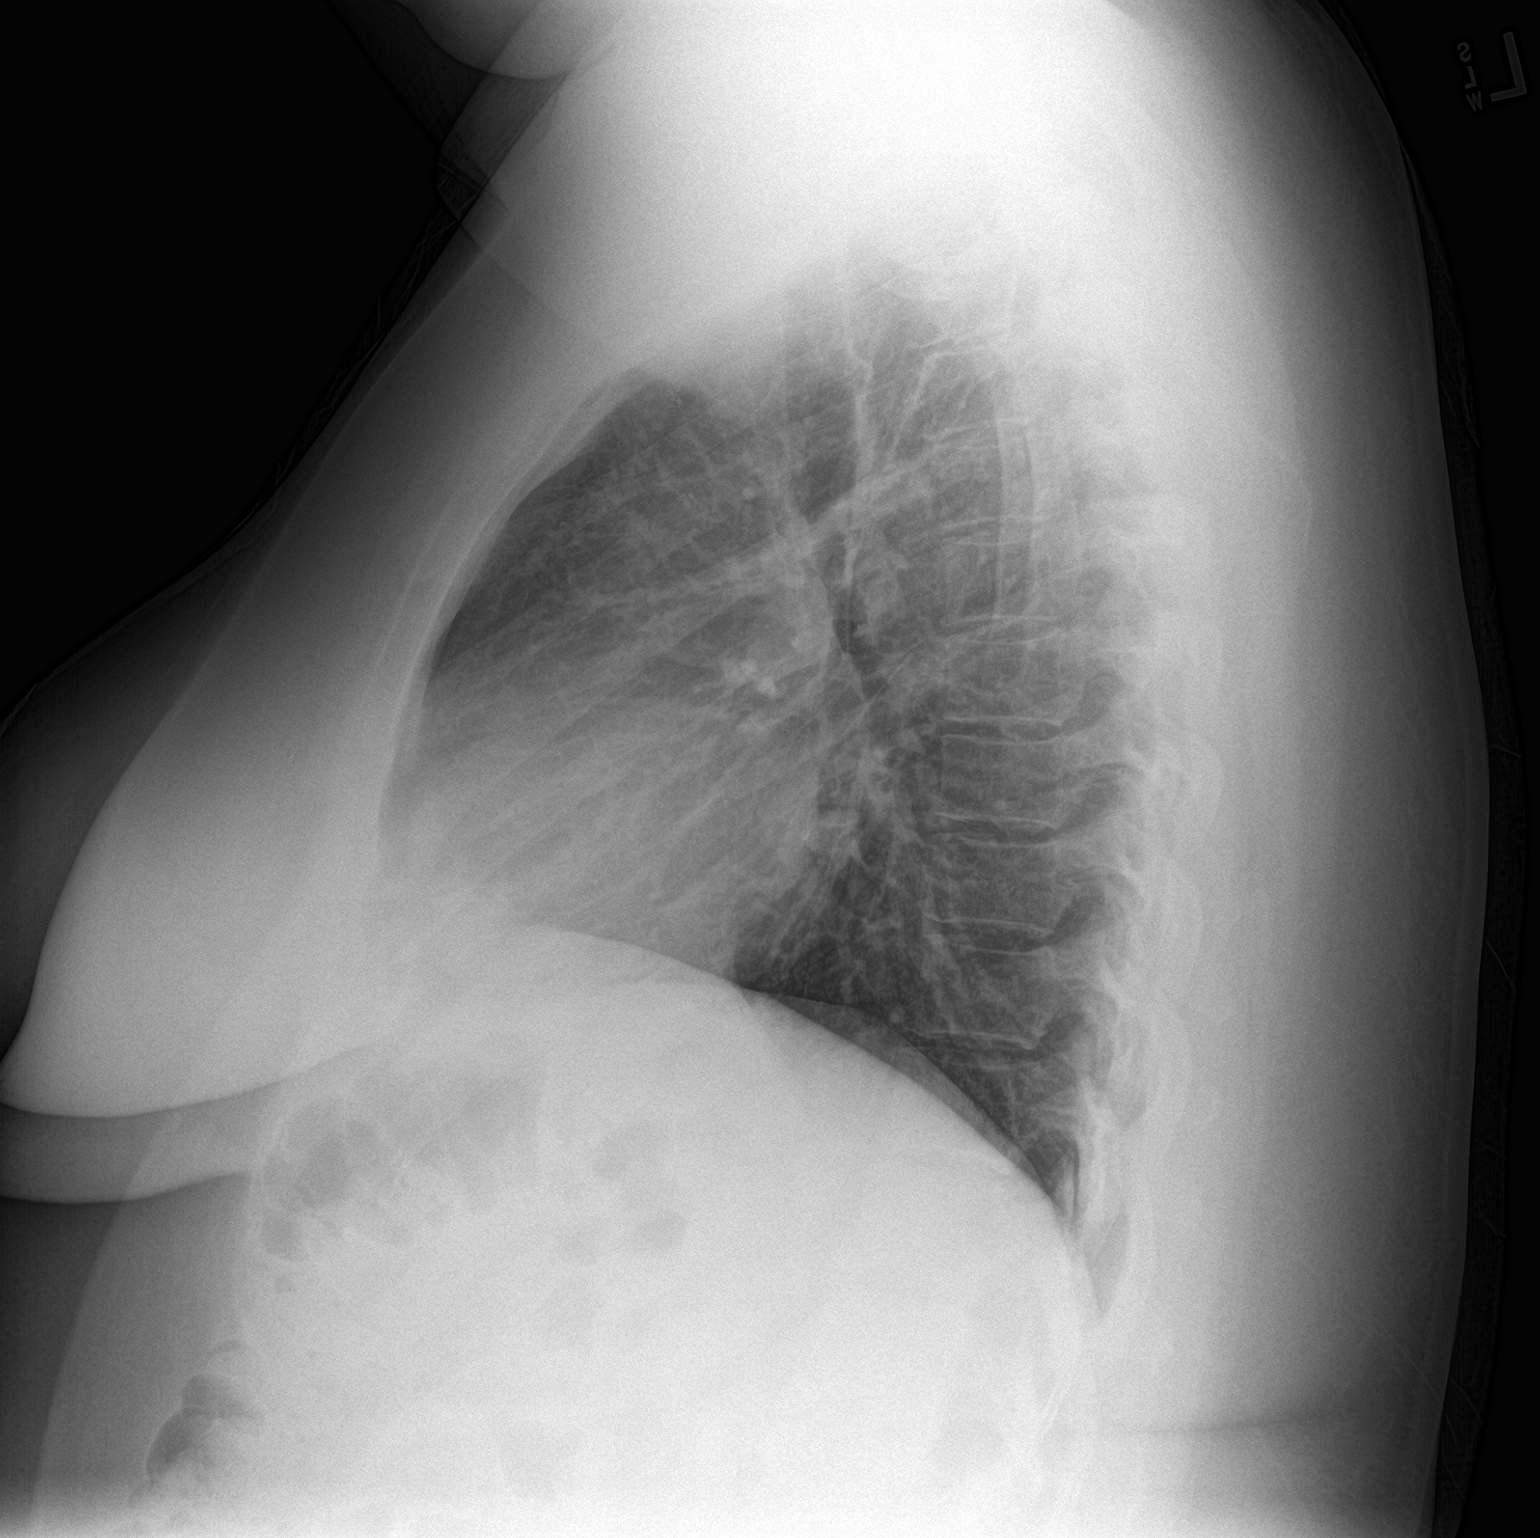

[2 of 2 positions shown; findings below may reference images not displayed]

FINDINGS: The heart size and mediastinal contours are within normal limits.
Both lungs are clear. The visualized skeletal structures are
unremarkable.
IMPRESSION: No active cardiopulmonary disease.

## 2018-02-10 ENCOUNTER — Ambulatory Visit (HOSPITAL_COMMUNITY): Payer: Self-pay | Admitting: Psychiatry
